# Patient Record
Sex: Female | Born: 1940 | Hispanic: Yes | Marital: Married | Smoking: Never smoker
Health system: Western US, Academic
[De-identification: ages and names within clinical notes are randomized; demographics above are authoritative.]

## PROBLEM LIST (undated history)

## (undated) DIAGNOSIS — N184 Chronic kidney disease, stage 4 (severe): Secondary | ICD-10-CM

## (undated) DIAGNOSIS — E119 Type 2 diabetes mellitus without complications: Secondary | ICD-10-CM

## (undated) DIAGNOSIS — I10 Essential (primary) hypertension: Secondary | ICD-10-CM

---

## 2018-09-30 ENCOUNTER — Other Ambulatory Visit: Payer: Self-pay | Admitting: Gastroenterology

## 2018-09-30 DIAGNOSIS — K8050 Calculus of bile duct without cholangitis or cholecystitis without obstruction: Secondary | ICD-10-CM

## 2018-10-06 ENCOUNTER — Telehealth: Payer: Self-pay | Admitting: Gastroenterology

## 2018-10-06 DIAGNOSIS — K8050 Calculus of bile duct without cholangitis or cholecystitis without obstruction: Secondary | ICD-10-CM

## 2018-10-06 NOTE — Telephone Encounter (Signed)
Called patient with interpreter over phone, patient is feeling better since having ERCP with biliary stent placement on 9/18 ( 7 Fr x 5 cm). Patient was noted to have a large peri-ampullary diverticulum and a large CBD stone (1.3 x 1.7 cm) occupying entire bile duct lumen. Stone was unable to be removed. Patient is agreeable to pursuing ERCP on 10/7 at 12pm. She understands that she will need COVID test prior to procedure as well. Orders placed.    Debe Coder, MD  Advanced Endoscopy Fellow  PGY-7  Pager: [phone]

## 2018-10-11 NOTE — Pre Sedation (Addendum)
Gastroenterology/hepatology Pre-procedure History & Physical   IMMEDIATE PRE-SEDATION ASSESSMENT              Referring provider: No primary care provider on file.    Procedure: ERCP     HPI: Cristina Carter is a 78yr old female with choledocholithiasis with ERCP done on 9/18 at OSH with placement of a 7 Fr x 5 cm plastic biliary stent. 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 Unable to extract stone at that time. Presenting for stent removal and stone extraction today    ROS: A complete ROS was obtained and is negative except as mentioned in the HPI.    All available medical, surgical, personal/social history was reviewed.     Physical exam:   GENERAL: A&Ox3, NAD.  HEENT: Anicteric sclera  CARDIAC: Normal rate, regular rhythm.   LUNGS: CTAB  ABDOMEN: Normoactive bowel sounds.  Soft, nondistended, nontender.  No guarding or rebound.   EXTREMITIES: No LE edema.   SKIN: No jaundice.    Labs/imaging:   Performed at [facility], reviewed: Refer to computer database in the electronic medical record.   Performed outside of [facility] (reviewed if applicable): N/A    Impression/ plan:   ERCP with cholangioscopy/EHL     Patient has given permission to discuss biopsy/procedure results with family and/or leave message on voicemail.    Pre-Sedation/Anesthesia Assessment:    Patient is ASA status: 3 - Moderate to severe systemic disease that limits activity but not incapacitating    Airway Assessment:    Mallampati class 2  ROM normal        Debe Coder, MD  Advanced Endoscopy Fellow  PGY-7  Pager: [phone]    =====================    This patient was seen, evaluated and care plan was developed with the Fellow.  I agree with the assessment and plan as outlined in the [place] note.        Electronically signed by:  Harley Alto, MD, [facility]  Clinical Professor

## 2018-10-12 ENCOUNTER — Ambulatory Visit: Payer: Medicare Other | Attending: Gastroenterology

## 2018-10-12 DIAGNOSIS — K8050 Calculus of bile duct without cholangitis or cholecystitis without obstruction: Secondary | ICD-10-CM | POA: Insufficient documentation

## 2018-10-12 DIAGNOSIS — Z20828 Contact with and (suspected) exposure to other viral communicable diseases: Secondary | ICD-10-CM | POA: Insufficient documentation

## 2018-10-12 LAB — COVID-2019 RNA, QUAL: SARS-CoV-2: NOT DETECTED

## 2018-10-12 NOTE — Nursing Note (Signed)
Patient identified using two identifiers.   The following procedure was completed per physician's order:  COVID 19 Test  Provided patient information regarding procedure  Nasopharyngeal swab collected on both sides of the patient's nose:  Yes   COVID specimen was sent to lab.     Droplet precautions were followed when caring the patient:  Patient was wearing a mask: Yes     During procedure, I was wearing full PPE: gown, face shield/googles, gloves, and N95 mask.  Patient remained in their vehicle during specimen collection and did not enter the clinic.  Patient tolerated procedure well.    Toshiye Kever Jane de Guzman-Pascual, LVN

## 2018-10-14 ENCOUNTER — Encounter: Payer: Self-pay | Admitting: Gastroenterology

## 2018-10-14 ENCOUNTER — Ambulatory Visit (HOSPITAL_BASED_OUTPATIENT_CLINIC_OR_DEPARTMENT_OTHER): Payer: Medicare Other | Admitting: Anesthesiology

## 2018-10-14 ENCOUNTER — Ambulatory Visit
Admission: RE | Admit: 2018-10-14 | Discharge: 2018-10-14 | Disposition: A | Discharge status: Home / Self Care | Payer: Medicare Other | Source: Ambulatory Visit | Attending: Gastroenterology | Admitting: Gastroenterology

## 2018-10-14 ENCOUNTER — Ambulatory Visit: Payer: Medicare Other | Admitting: Anesthesiology

## 2018-10-14 DIAGNOSIS — E781 Pure hyperglyceridemia: Secondary | ICD-10-CM

## 2018-10-14 DIAGNOSIS — K8050 Calculus of bile duct without cholangitis or cholecystitis without obstruction: Secondary | ICD-10-CM | POA: Insufficient documentation

## 2018-10-14 DIAGNOSIS — N189 Chronic kidney disease, unspecified: Secondary | ICD-10-CM

## 2018-10-14 DIAGNOSIS — I447 Left bundle-branch block, unspecified: Secondary | ICD-10-CM | POA: Insufficient documentation

## 2018-10-14 DIAGNOSIS — R9431 Abnormal electrocardiogram [ECG] [EKG]: Secondary | ICD-10-CM

## 2018-10-14 DIAGNOSIS — I10 Essential (primary) hypertension: Secondary | ICD-10-CM

## 2018-10-14 HISTORY — DX: Essential (primary) hypertension: I10

## 2018-10-14 HISTORY — DX: Type 2 diabetes mellitus without complications: E11.9

## 2018-10-14 HISTORY — PX: EGD: GILAB00003

## 2018-10-14 HISTORY — PX: ERCP: GILAB00005

## 2018-10-14 LAB — POC ABL CRITICAL LAB PANEL
Anion Gap: 14.4 mmol/L
Anion GapC: 10.4 mmol/L
CBase: -0.7 mmol/L (ref <=3.0)
CO2: 25.5 mmol/L
Cl-: 108 mmol/L (ref 95–110)
Glucose: 142 mg/dL — ABNORMAL HIGH (ref 70–99)
HCO3: 24.3 mmol/L (ref 20.0–28.0)
K+: 4 mmol/L (ref 3.3–4.8)
Lactate: 1.1 mmol/L (ref 0.4–1.3)
Na+: 142 mmol/L (ref 137–147)
O2 Sat: 82.4 % (ref 70.0–99.3)
POC ABL90 FMetHb: 0.1 % (ref 0.0–1.5)
POC ABL90 Hematocrit: 39.3 % (ref 33.0–53.0)
POC ABL90 Patient Temp: 37 Cel
POC ABL90 Temp corrected POC2: 40.3 mmHg — ABNORMAL LOW (ref 42.0–50.0)
POC ABL90 Temp corrected pH: 7.388 (ref 7.300–7.400)
ctHgb: 12.8 g/dL (ref 12.00–16.00)
fCOHb: 1.3 % (ref 0.5–1.5)
fO2Hb: 81.2 %
ion Ca: 1.22 mmol/L (ref 1.17–1.31)
pCO2: 40.3 mmHg — ABNORMAL LOW (ref 42.0–50.0)
pH: 7.388 (ref 7.300–7.400)
pO2 Temp Corrected: 45.9 mmHg (ref 30.0–55.0)
pO2: 45.9 mmHg (ref 30.0–55.0)

## 2018-10-14 MED ORDER — LEVOFLOXACIN 500 MG/100 ML IN 5 % DEXTROSE INTRAVENOUS PIGGYBACK
500.0000 mg | INJECTION | Freq: Once | INTRAVENOUS | Status: AC
Start: 2018-10-14 — End: 2018-10-14
  Administered 2018-10-14: 14:00:00 500 mg via INTRAVENOUS
  Filled 2018-10-14: qty 100

## 2018-10-14 MED ORDER — INTRAOP ROCURONIUM 10 MG/ML INJ 5 ML VIAL
Status: DC | PRN
Start: 2018-10-14 — End: 2018-10-14
  Administered 2018-10-14: 50 mg via INTRAVENOUS

## 2018-10-14 MED ORDER — PROPOFOL 10 MG/ML INTRAVENOUS EMULSION
INTRAVENOUS | Status: AC
Start: 2018-10-14 — End: 2018-10-14
  Filled 2018-10-14: qty 20

## 2018-10-14 MED ORDER — WATER FOR INJECTION, STERILE INJECTION SOLUTION
1.0000 mL | Freq: Once | INTRAMUSCULAR | Status: AC
Start: 2018-10-14 — End: 2018-10-14
  Administered 2018-10-14: 15:00:00 20 mL via INTRADUCTAL

## 2018-10-14 MED ORDER — INTRAOP FENTANYL 50 MCG/ML INJ 5 ML AMP
Status: DC | PRN
Start: 2018-10-14 — End: 2018-10-14
  Administered 2018-10-14: 15:00:00 50 ug via INTRAVENOUS
  Administered 2018-10-14: 100 ug via INTRAVENOUS

## 2018-10-14 MED ORDER — SIMETHICONE 40 MG/0.6 ML ORAL DROPS,SUSPENSION
40.0000 mg | Freq: Once | ORAL | Status: AC
Start: 2018-10-14 — End: 2018-10-14
  Administered 2018-10-14: 14:00:00 80 mg
  Filled 2018-10-14: qty 1.2

## 2018-10-14 MED ORDER — LIDOCAINE HCL 20 MG/ML (2 %) INJECTION SOLUTION
INTRAMUSCULAR | Status: DC | PRN
Start: 2018-10-14 — End: 2018-10-14
  Administered 2018-10-14: 3 mL via INTRAVENOUS

## 2018-10-14 MED ORDER — INTRAOP DEXMEDETOMIDINE 100 MCG/ML IV BOLUS DOSE
Status: DC | PRN
Start: 2018-10-14 — End: 2018-10-14
  Administered 2018-10-14: 13:00:00 60 ug via INTRAVENOUS

## 2018-10-14 MED ORDER — LACTATED RINGERS IV INFUSION
INTRAVENOUS | Status: DC
Start: 2018-10-14 — End: 2018-10-20
  Administered 2018-10-14: 13:00:00 via INTRAVENOUS

## 2018-10-14 MED ORDER — FLUMAZENIL 0.1 MG/ML INTRAVENOUS SOLUTION
0.2000 mg | INTRAVENOUS | Status: DC | PRN
Start: 2018-10-14 — End: 2018-10-20

## 2018-10-14 MED ORDER — FENTANYL (PF) 50 MCG/ML INJECTION SOLUTION
INTRAMUSCULAR | Status: AC
Start: 2018-10-14 — End: 2018-10-14
  Filled 2018-10-14: qty 4

## 2018-10-14 MED ORDER — CIPROFLOXACIN 500 MG TABLET
500.0000 mg | ORAL_TABLET | Freq: Two times a day (BID) | ORAL | 0 refills | Status: AC
Start: 2018-10-14 — End: 2018-10-17

## 2018-10-14 MED ORDER — ONDANSETRON HCL (PF) 4 MG/2 ML INJECTION SOLUTION
INTRAMUSCULAR | Status: DC | PRN
Start: 2018-10-14 — End: 2018-10-14
  Administered 2018-10-14: 14:00:00 4 mg via INTRAVENOUS

## 2018-10-14 MED ORDER — NALOXONE 0.4 MG/ML INJECTION SOLUTION
0.4000 mg | INTRAMUSCULAR | Status: DC | PRN
Start: 2018-10-14 — End: 2018-10-20

## 2018-10-14 MED ORDER — SUGAMMADEX 100 MG/ML INTRAVENOUS SOLUTION
INTRAVENOUS | Status: DC | PRN
Start: 2018-10-14 — End: 2018-10-14
  Administered 2018-10-14: 200 mg via INTRAVENOUS

## 2018-10-14 MED ORDER — INTRAOP PROPOFOL INJ 20 ML VIAL (BOLUS+INFUSION)
Status: DC | PRN
Start: 2018-10-14 — End: 2018-10-14
  Administered 2018-10-14: 13:00:00 50 mg via INTRAVENOUS

## 2018-10-14 MED ORDER — EPINEPHRINE 0.1 MG/ML INJECTION SYRINGE
0.3000 mg | INJECTION | INTRAMUSCULAR | Status: DC | PRN
Start: 2018-10-14 — End: 2018-10-20
  Filled 2018-10-14: qty 20

## 2018-10-14 MED ORDER — INTRAOP DEXAMETHASONE 4 MG/ML INJ 1 ML VIAL
Status: DC | PRN
Start: 2018-10-14 — End: 2018-10-14
  Administered 2018-10-14: 14:00:00 10 mg via INTRAVENOUS

## 2018-10-14 MED ORDER — GLUCAGON HCL 1 MG/ML SOLUTION FOR INJECTION
0.2500 mg | INTRAMUSCULAR | Status: DC
Start: 2018-10-14 — End: 2018-10-20
  Filled 2018-10-14: qty 2

## 2018-10-14 MED ORDER — LIDOCAINE HCL 2 % MUCOSAL SOLUTION
5.0000 mL | Freq: Once | Status: DC
Start: 2018-10-14 — End: 2018-10-20
  Filled 2018-10-14: qty 15

## 2018-10-14 MED ORDER — INDOMETHACIN 50 MG RECTAL SUPPOSITORY
100.0000 mg | Freq: Once | RECTAL | Status: AC
Start: 2018-10-14 — End: 2018-10-14
  Administered 2018-10-14: 100 mg via RECTAL
  Filled 2018-10-14: qty 2

## 2018-10-14 MED ORDER — EPINEPHRINE 0.1 MG/ML INJECTION SYRINGE
1.0000 mg | INJECTION | INTRAMUSCULAR | Status: DC | PRN
Start: 2018-10-14 — End: 2018-10-20

## 2018-10-14 MED ORDER — ATROPINE 0.1 MG/ML INJECTION SYRINGE
0.5000 mg | INJECTION | INTRAMUSCULAR | Status: DC | PRN
Start: 2018-10-14 — End: 2018-10-20

## 2018-10-14 MED ORDER — IOHEXOL 300 MG IODINE/ML INTRAVENOUS SOLUTION
1.0000 mL | Freq: Once | INTRAVENOUS | Status: DC
Start: 2018-10-14 — End: 2018-10-20

## 2018-10-14 NOTE — Nurse Discharge Note (Signed)
Received report form Loa Socks, RN post ERCP procedure that patient's EKG showed LBBB, needs to establish care with Cardiologist. Written discharged instructions reviewed with member via [employer]	[place], via [employer]. Member states she will let her PCP know about her abnormal EKG. Ambulated to bathroom in steady gait with daughter's assist. NAD noted. Discharged via wheelchair escorted by transport accompanied by daughter.

## 2018-10-14 NOTE — Anesthesia Postprocedure Evaluation (Signed)
Patient: Cristina Carter    ERCP W/ANES    Anesthesia Type: general    Stop Bang:      Vital Signs (Last Recorded):  BP: 118/75 (10/14/18 1545)  Pulse: 89 (10/14/18 1545)  Resp: 13 (10/14/18 1545)  Temp Max: 36.5 C (97.7 F)  (Last 24 hours)  Temp: 36.5 C (97.7 F) (10/14/18 1457)  SpO2: 94 % (10/14/18 1545) on    Device (Oxygen Therapy): room air (10/14/18 1545)      Anesthesia Post Evaluation    Procedure: * No procedures listed *  Location: [facility] ANEST[facility]IA OR (DON'T SCHEDULE)  Anesthesia: General    Patient location during evaluation: PACU  Patient participation: complete - patient participated  Level of consciousness: awake and alert  Pain score: 0  Pain management: adequate  Airway patency: patent  Anesthetic complications: no  Cardiovascular status: blood pressure returned to baseline  Respiratory status: unassisted  Hydration status: euvolemic  Nausea and Vomiting: absent     Rosalina Dingwall Fredricka Bonine, MD          Darrick Meigs Fredricka Bonine, MD

## 2018-10-14 NOTE — Anesthesia Procedure Notes (Signed)
Airway  Date/Time: 10/14/2018 1:22 PM    Staff    Patient location:  GI LAB          Indications and Patient Condition    Spontaneous Ventilation: absent  Sedation level: level 0: deep/analgesia  Preoxygenated: yes  Patient position: sniffing  MILS not maintained throughout  Mask difficulty assessment: 1 - vent by mask    Final Airway Details    Final airway type: endotracheal airway      Successful airway: cuffed ETT    Successful intubation technique: direct laryngoscopy  Facilitating devices/methods: intubating stylet  Endotracheal tube insertion site: oral  Blade: Miller  Blade size: #2  ETT size: 7.0 mm  Cormack-Lehane Classification: grade I - full view of glottis  Placement verified by: chest auscultation and capnometry   Measured from: teeth  Secured at 22 cm.  Number of attempts at approach: 1  Ventilation between attempts: none  Number of other approaches attempted: 0

## 2018-10-14 NOTE — Anesthesia Preprocedure Evaluation (Addendum)
Anesthesia Evaluation    History of Present Illness  78 YO F presents to GI Lab for ERCP  Fasted  No heart problems or stroke   denies diabetes  Some pain in epigastrium probably from gall stones  ECG done in preop  Negative anesthesia history    Airway   Mallampati: II  TM distance: >3 FB     Neck ROM is limited.     Dental    (+) missing       Pulmonary - negative for pulmonary conditions with ROS and normal exam    Patient's breath sounds clear to auscultation. Cardiovascular - cardiovascular exam normal  (+) hypertension (well controlled),  hypertriglyceridemia,     ECG reviewed  Patient has poor exercise tolerance. (Pt ambulates with cane. Activity limited by pain in knees and hips.)   Neuro/Psych - negative neuro/psych ROS    GI/Hepatic/Renal   (+) chronic renal disease (CKD),         Abdominal    Endo/Other - negative for endocrine conditions with ROS    (-) diabetes mellitus  Smoking History                        Anesthesia Plan    ASA 2     general     Intravenous induction    Postoperative administration of opioids is intended.  Trial extubation is planned.    Anesthetic plan and risks discussed with patient.  Resident/Fellow/CRNA discussed the plan with the attending.  I personally performed a physical assessment on this patient.

## 2018-10-14 NOTE — Procedures (Addendum)
Pre-Procedure Time Out Checklist  (do not remove)     Attestation:  ID verified by two sources (select any two from list): MRN, DOB and Name  Was this an emergency procedure?  no     I attest that I verified the following information prior to performing the procedure: Patient ID and Procedure     Procedure: Endoscopic Retrograde Cholangiopancreatography    Patient Name:  Cristina Carter  MRN: [id number]    DOB: 03-Oct-1940  DOS: 10/14/2018    GASTROENTEROLOGY OPERATION RECORD    PROCEDURE: Endoscopic Retrograde Cholangiopancreatography    SUB-PROCEDURE:  1. Cannulation of bile duct with BSC hydrotome and "0.035 guidewire alongside previously placed plastic biliary stent  2. Removal of previously placed biliary stent with micro mini snare  3. Cholangiogram  4. Cholangioscopy with EHL  5. Sphincteroplasty using 10 mm x 4 cm biliary dilation balloon  6. Basket sweeps with Olympus VorticCatch wire-guided basket  7. Balloon sweeps with BSC extraction balloon  8. Occlusion cholangiogram      ENDO ATTENDING: Travell Desaulniers was present during the entire viewing portion of the procedure    PRIMARY ASSISTANT: Debe Coder, MD      REFERRING PHYSICIAN:  Johnna Acosta, MD      INDICATION:   78yr old female with choledocholithiasis with ERCP done on 9/18 at OSH with placement of a 7 Fr x 5 cm plastic biliary stent. 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 Unable to extract stone at that time. Presenting for stent removal and stone extraction today       HEALTH STATUS (ASA GRADE): moderate stable      CONSENT:  Informed consent was obtained with explanation of risks, benefits, and alternative options. We explained that risks included, but were not restricted to, infection, bleeding, damage to organs including perforation, reaction to medicines, missed lesion, incomplete exam, and pancreatitis.    Time out was performed to verify patient's name, medical record number, and procedure to be  performed.      START TIME: 1336  END TIME: 1444      MEDICATION: General anesthesia   100 mg rectal Indomethacin was given  500 mg Levofloxacin was given   1L of LR was given    FLUOROSCOPY: 244.2 sec, 115 mGy         PROCEDURE DETAILS:    The procedure was performed at [facility]. Written informed consent was obtained and patient placed in the semiprone swimmer's position. Vital signs were monitored throughout the procedure and noted to be stable. Surgical pause was performed. A side viewing endoscope of TJF180 series was used for the procedure. The scope was advanced through the bite block into the eosphagus, through the GE junction, into the stomach, through the pylorus into the duodenal bulb and eventually the second portion of the duodenum. The scope was then pulled into the short scope position.    Patient with tortuous duodenum.  Previously seen plastic biliary stent seen protruding from biliary orifice.  A large periampullary diverticulum was noted.  Ampulla was located along the proximal and inferior margin of the diverticulum.  Using the [facility] hydro-tome and a "0.035 guidewire, the bile duct was electively cannulated alongside the previously placed plastic biliary stent.  Using a micro mini snare, the biliary stent was then removed.  A cholangiogram was then performed which revealed a dilated bile duct to 12 mm with a shelf-like deformity noted in the distal CBD indicative of an impacted stone in  the distal CBD.  The cholangioscope was then inserted over guidewire into the bile duct.  A large yellow brown pigment stone was seen occupying the entire lumen of the bile duct.  Using an EHL probe, EHL was performed with the stone fragmented into multiple smaller fragments.  The cholangioscope was then removed and a sphincteroplasty was then performed with a 10 mm x 4 cm biliary dilation balloon.  A small amount of self-limited bleeding noted after sphincteroplasty.  An Olympus VorticCatch 20 mm wire  guided basket was then inserted into the bile duct.  Multiple basket sweeps were performed with removal of large amount of stone fragments and biliary sludge.  A cholangiogram was then performed through the basket which revealed additional filling defects in the more proximal CBD.  Additional basket sweeps were performed with removal of additional stone fragments.  A BSC extraction balloon was then inserted over guidewire to the bile duct.  Multiple balloon sweeps were performed with mostly clear bile extracted.  An occlusion cholangiogram was then performed which revealed no additional filling defects.  Given the bile duct was clear and there was good bile flow coming from biliary orifice, decision made not to place a biliary stent.  IV Levaquin and rectal indomethacin given at the end of the procedure.      At the conclusion of the procedure, the accessories were withdrawn, wheels were unlocked, air was withdrawn from the small bowel, and scope was withdrawn back into the stomach. Then, the stomach was decompressed and the scope was completely withdrawn out of the patient. The patient tolerated the procedure well. Post procedure complications: none. Attending physician was present throughout the entire procedure.        IMPRESSION:  1. Choledocholithiasis with large brown pigment stone impacted in distal CBD s/p treatment with EHL and removal of stone fragments as described above. Previously placed biliary stent was removed. Sphincteroplasty was performed to 10 mm  2. Large peri-ampullary diverticulum    RECOMMENDATIONS:   Monitor for immediate complications such as post-ERCP pancreatitis (epigastric pain/nausea/vomiting), post-sphincterotomy bleeding, perforation, etc.    PO Ciprofloxacin 500mg  BID for 3 days   Patient can be referred to surgery for evaluation for cholecystectomy      Debe Coder, MD  Advanced Endoscopy Fellow  PGY-7  Pager: [phone]  ====================    I supervised the entire procedure.    This patient was seen, evaluated and care plan was developed with the Fellow.  I agree with the assessment and plan as outlined in the [place] note.      Sincerely,     Electronically signed by:  Harley Alto, MD, [facility]  Clinical Professor

## 2018-10-15 NOTE — Discharge Instructions (Signed)
Discharge Instructions for ERCP    Findings:  ERCP bile duct stone(s)    Activity:    Rest today, resume usual activitiy tomorrow     Diet:    Resume usual diet:     Medication:    Resume all usual medications    Follow up:   Call primary care physician for follow-up appointment      During your procedure, air was pumped into your GI tract so your doctor could see clearly to make a diagnosis and/or treat your problem.     Some possible side effects you may experience are:   - Discomfort due to a distended (bloated) abdomen which will subside after a few hours to two days.   - Nausea may be a side effect of the medication and will subside.   - The medications you received may make you dizzy and sleepy, it is important that you do not drive, operate machinery or drink alcohol for at least one day.   - Severe pain is not expected and should be reported    Other side effects may include:  ERCP: if fever, vomiting blood, black stools, or persistent abdominal pain call the phone number listed below.    If problems, call [facility] lab at [phone] during business hours Monday through Friday 7:30am to 4:30 pm.  After hours call [phone] and ask to speak to the GI Fellow on call.

## 2018-10-16 LAB — ELECTROCARDIOGRAM WITH RHYTHM STRIP: QTC: 500

## 2019-01-23 ENCOUNTER — Other Ambulatory Visit: Payer: Self-pay | Admitting: Family Medicine

## 2019-06-08 DEATH — deceased

## 2020-07-06 ENCOUNTER — Emergency Department (EMERGENCY_DEPARTMENT_HOSPITAL): Payer: Medicare Other

## 2020-07-06 ENCOUNTER — Inpatient Hospital Stay
Admission: AC | Admit: 2020-07-06 | Discharge: 2020-07-13 | DRG: 480 | Disposition: A | Discharge status: Home Health | Payer: Medicare Other | Source: Other Acute Inpatient Hospital | Attending: ORTHOPAEDIC SURGERY | Admitting: ORTHOPAEDIC SURGERY

## 2020-07-06 DIAGNOSIS — T1490XA Injury, unspecified, initial encounter: Secondary | ICD-10-CM

## 2020-07-06 DIAGNOSIS — W010XXA Fall on same level from slipping, tripping and stumbling without subsequent striking against object, initial encounter: Secondary | ICD-10-CM | POA: Diagnosis present

## 2020-07-06 DIAGNOSIS — M9701XA Periprosthetic fracture around internal prosthetic right hip joint, initial encounter: Secondary | ICD-10-CM | POA: Diagnosis present

## 2020-07-06 DIAGNOSIS — K5730 Diverticulosis of large intestine without perforation or abscess without bleeding: Secondary | ICD-10-CM

## 2020-07-06 DIAGNOSIS — Z28311 Partially vaccinated for covid-19: Secondary | ICD-10-CM

## 2020-07-06 DIAGNOSIS — S72301A Unspecified fracture of shaft of right femur, initial encounter for closed fracture: Secondary | ICD-10-CM

## 2020-07-06 DIAGNOSIS — M546 Pain in thoracic spine: Secondary | ICD-10-CM

## 2020-07-06 DIAGNOSIS — E669 Obesity, unspecified: Secondary | ICD-10-CM | POA: Diagnosis present

## 2020-07-06 DIAGNOSIS — M545 Low back pain, unspecified: Secondary | ICD-10-CM

## 2020-07-06 DIAGNOSIS — K829 Disease of gallbladder, unspecified: Secondary | ICD-10-CM

## 2020-07-06 DIAGNOSIS — Y9301 Activity, walking, marching and hiking: Secondary | ICD-10-CM | POA: Diagnosis present

## 2020-07-06 DIAGNOSIS — I120 Hypertensive chronic kidney disease with stage 5 chronic kidney disease or end stage renal disease: Secondary | ICD-10-CM

## 2020-07-06 DIAGNOSIS — N184 Chronic kidney disease, stage 4 (severe): Secondary | ICD-10-CM | POA: Diagnosis present

## 2020-07-06 DIAGNOSIS — Z6831 Body mass index (BMI) 31.0-31.9, adult: Secondary | ICD-10-CM

## 2020-07-06 DIAGNOSIS — J9601 Acute respiratory failure with hypoxia: Secondary | ICD-10-CM | POA: Diagnosis not present

## 2020-07-06 DIAGNOSIS — Y998 Other external cause status: Secondary | ICD-10-CM

## 2020-07-06 DIAGNOSIS — M25511 Pain in right shoulder: Secondary | ICD-10-CM

## 2020-07-06 DIAGNOSIS — M79621 Pain in right upper arm: Secondary | ICD-10-CM

## 2020-07-06 DIAGNOSIS — N186 End stage renal disease: Secondary | ICD-10-CM

## 2020-07-06 DIAGNOSIS — M810 Age-related osteoporosis without current pathological fracture: Secondary | ICD-10-CM | POA: Diagnosis present

## 2020-07-06 DIAGNOSIS — M25551 Pain in right hip: Secondary | ICD-10-CM

## 2020-07-06 DIAGNOSIS — R9431 Abnormal electrocardiogram [ECG] [EKG]: Secondary | ICD-10-CM

## 2020-07-06 DIAGNOSIS — Z79899 Other long term (current) drug therapy: Secondary | ICD-10-CM

## 2020-07-06 DIAGNOSIS — R918 Other nonspecific abnormal finding of lung field: Secondary | ICD-10-CM

## 2020-07-06 DIAGNOSIS — M542 Cervicalgia: Secondary | ICD-10-CM

## 2020-07-06 DIAGNOSIS — K8020 Calculus of gallbladder without cholecystitis without obstruction: Secondary | ICD-10-CM

## 2020-07-06 DIAGNOSIS — E1122 Type 2 diabetes mellitus with diabetic chronic kidney disease: Secondary | ICD-10-CM | POA: Diagnosis present

## 2020-07-06 DIAGNOSIS — I4510 Unspecified right bundle-branch block: Secondary | ICD-10-CM

## 2020-07-06 DIAGNOSIS — Z96651 Presence of right artificial knee joint: Secondary | ICD-10-CM | POA: Diagnosis present

## 2020-07-06 DIAGNOSIS — Z20822 Contact with and (suspected) exposure to covid-19: Secondary | ICD-10-CM

## 2020-07-06 DIAGNOSIS — R079 Chest pain, unspecified: Secondary | ICD-10-CM

## 2020-07-06 DIAGNOSIS — M978XXA Periprosthetic fracture around other internal prosthetic joint, initial encounter: Secondary | ICD-10-CM

## 2020-07-06 DIAGNOSIS — I129 Hypertensive chronic kidney disease with stage 1 through stage 4 chronic kidney disease, or unspecified chronic kidney disease: Secondary | ICD-10-CM | POA: Diagnosis present

## 2020-07-06 DIAGNOSIS — D62 Acute posthemorrhagic anemia: Secondary | ICD-10-CM | POA: Diagnosis not present

## 2020-07-06 DIAGNOSIS — K219 Gastro-esophageal reflux disease without esophagitis: Secondary | ICD-10-CM | POA: Diagnosis present

## 2020-07-06 DIAGNOSIS — J9811 Atelectasis: Secondary | ICD-10-CM | POA: Diagnosis not present

## 2020-07-06 DIAGNOSIS — M19021 Primary osteoarthritis, right elbow: Secondary | ICD-10-CM

## 2020-07-06 HISTORY — DX: Chronic kidney disease, stage 4 (severe): N18.4

## 2020-07-06 LAB — CBC WITH DIFFERENTIAL
Basophils % Auto: 0.9 %
Basophils Abs Auto: 0.1 10*3/uL (ref 0.0–0.2)
Eosinophils % Auto: 1.2 %
Eosinophils Abs Auto: 0.1 10*3/uL (ref 0.0–0.5)
Hematocrit: 33.3 % — ABNORMAL LOW (ref 36.0–46.0)
Hemoglobin: 11.5 g/dL — ABNORMAL LOW (ref 12.0–16.0)
Lymphocytes % Auto: 17.1 %
Lymphocytes Abs Auto: 1.1 10*3/uL (ref 1.0–4.8)
MCH: 31.9 pg (ref 27.0–33.0)
MCHC: 34.4 % (ref 32.0–36.0)
MCV: 92.7 fL (ref 80.0–100.0)
MPV: 8.9 fL (ref 6.8–10.0)
Monocytes % Auto: 11.6 %
Monocytes Abs Auto: 0.8 10*3/uL (ref 0.1–0.8)
Neutrophils % Auto: 69.2 %
Neutrophils Abs Auto: 4.6 10*3/uL (ref 1.8–7.7)
Platelet Count: 183 10*3/uL (ref 130–400)
RDW: 13.6 % (ref 0.0–14.7)
Red Blood Cell Count: 3.59 10*6/uL — ABNORMAL LOW (ref 4.00–5.20)
White Blood Cell Count: 6.7 10*3/uL (ref 4.5–11.0)

## 2020-07-06 LAB — CBC NO DIFFERENTIAL
Hematocrit: 34.9 % — ABNORMAL LOW (ref 36.0–46.0)
Hematocrit: 34 % — ABNORMAL LOW (ref 36.0–46.0)
Hematocrit: 33.3 % — ABNORMAL LOW (ref 36.0–46.0)
Hemoglobin: 12.2 g/dL (ref 12.0–16.0)
Hemoglobin: 11.7 g/dL — ABNORMAL LOW (ref 12.0–16.0)
Hemoglobin: 11.5 g/dL — ABNORMAL LOW (ref 12.0–16.0)
MCH: 32.5 pg (ref 27.0–33.0)
MCH: 31.9 pg (ref 27.0–33.0)
MCH: 31.9 pg (ref 27.0–33.0)
MCHC: 35.1 % (ref 32.0–36.0)
MCHC: 34.4 % (ref 32.0–36.0)
MCHC: 34.4 % (ref 32.0–36.0)
MCV: 92.7 fL (ref 80.0–100.0)
MCV: 92.8 fL (ref 80.0–100.0)
MCV: 92.7 fL (ref 80.0–100.0)
MPV: 9 fL (ref 6.8–10.0)
MPV: 8.7 fL (ref 6.8–10.0)
MPV: 8.9 fL (ref 6.8–10.0)
Platelet Count: 192 10*3/uL (ref 130–400)
Platelet Count: 189 10*3/uL (ref 130–400)
Platelet Count: 183 10*3/uL (ref 130–400)
RDW: 13.2 % (ref 0.0–14.7)
RDW: 13.4 % (ref 0.0–14.7)
RDW: 13.6 % (ref 0.0–14.7)
Red Blood Cell Count: 3.76 10*6/uL — ABNORMAL LOW (ref 4.00–5.20)
Red Blood Cell Count: 3.67 10*6/uL — ABNORMAL LOW (ref 4.00–5.20)
Red Blood Cell Count: 3.59 10*6/uL — ABNORMAL LOW (ref 4.00–5.20)
White Blood Cell Count: 7.7 10*3/uL (ref 4.5–11.0)
White Blood Cell Count: 7 10*3/uL (ref 4.5–11.0)
White Blood Cell Count: 6.7 10*3/uL (ref 4.5–11.0)

## 2020-07-06 LAB — BLD GAS VENOUS
Base Excess, Ven: 0 mEq/L (ref <=2)
HCO3, Ven: 26 mEq/L (ref 20–28)
O2 Sat, Ven: 60 % — ABNORMAL LOW (ref 70–100)
PCO2, Ven: 48 mm Hg (ref 35–50)
PO2, Ven: 32 mm Hg (ref 30–55)
pH, VEN: 7.34 (ref 7.30–7.40)

## 2020-07-06 LAB — PHOSPHORUS (PO4): Phosphorus (PO4): 4 mg/dL (ref 2.5–4.5)

## 2020-07-06 LAB — INR
INR: 0.96 (ref 0.87–1.18)
Prothrombin Time: 8.9 secs (ref 8.4–10.7)

## 2020-07-06 LAB — BASIC METABOLIC PANEL
Calcium: 9.3 mg/dL (ref 8.6–10.0)
Calcium: 9.1 mg/dL (ref 8.6–10.0)
Carbon Dioxide Total: 21 mmol/L — ABNORMAL LOW (ref 22–29)
Carbon Dioxide Total: 21 mmol/L — ABNORMAL LOW (ref 22–29)
Chloride: 105 mmol/L (ref 98–107)
Chloride: 104 mmol/L (ref 98–107)
Creatinine Serum: 0.94 mg/dL (ref 0.51–1.17)
Creatinine Serum: 0.97 mg/dL (ref 0.51–1.17)
E-GFR Creatinine (Female): 57 mL/min/{1.73_m2}
E-GFR Creatinine (Female): 55 mL/min/{1.73_m2}
Glucose: 130 mg/dL — ABNORMAL HIGH (ref 74–109)
Glucose: 151 mg/dL — ABNORMAL HIGH (ref 74–109)
Potassium: 4.3 mmol/L (ref 3.4–5.1)
Potassium: 4.1 mmol/L (ref 3.4–5.1)
Sodium: 139 mmol/L (ref 136–145)
Sodium: 139 mmol/L (ref 136–145)
Urea Nitrogen, Blood (BUN): 15 mg/dL (ref 6–20)
Urea Nitrogen, Blood (BUN): 15 mg/dL (ref 6–20)

## 2020-07-06 LAB — HEPATIC FUNCTION PANEL
Alanine Transferase (ALT): 19 U/L (ref <=33)
Albumin: 4.5 g/dL (ref 4.0–4.9)
Alkaline Phosphatase (ALP): 85 U/L (ref 35–129)
Aspartate Transaminase (AST): 21 U/L (ref <=41)
Bilirubin Direct: <0.2 mg/dL (ref 0.0–0.3)
Bilirubin Total: 0.5 mg/dL (ref <=1.2)
Protein: 7.1 g/dL (ref 6.6–8.7)

## 2020-07-06 LAB — APTT STUDIES: aPTT: 28.4 secs — ABNORMAL LOW (ref 28.8–39.9)

## 2020-07-06 LAB — ALBUMIN: Albumin: 4.2 g/dL (ref 4.0–4.9)

## 2020-07-06 LAB — TYPE AND SCREEN
Ab Scrn-Gel: NEGATIVE
Patient Blood Type: B POS

## 2020-07-06 LAB — COVID-19 (OUTSIDE ORGANIZATION): COVID-19 Results (Outside Organization): NOT DETECTED

## 2020-07-06 LAB — CALCIUM ION WHOLE BLOOD: Calcium Ion Whole Blood: 1.18 mmol/L (ref 1.17–1.31)

## 2020-07-06 LAB — ETHANOL, PLASMA: Ethanol, Plasma: <10 mg/dL (ref <10)

## 2020-07-06 LAB — LIPASE: Lipase: 458 U/L — ABNORMAL HIGH (ref 13–60)

## 2020-07-06 MED ORDER — ACETAMINOPHEN 650 MG/20.3 ML ORAL SOLUTION
1000.0000 mg | Freq: Four times a day (QID) | ORAL | Status: DC
Start: 2020-07-06 — End: 2020-07-06

## 2020-07-06 MED ORDER — ONDANSETRON HCL (PF) 4 MG/2 ML INJECTION SOLUTION
4.0000 mg | Freq: Two times a day (BID) | INTRAMUSCULAR | Status: DC | PRN
Start: 2020-07-06 — End: 2020-07-06

## 2020-07-06 MED ORDER — AMLODIPINE 5 MG TABLET
5.0000 mg | ORAL_TABLET | Freq: Every day | ORAL | Status: DC
Start: 2020-07-07 — End: 2020-07-13
  Administered 2020-07-07 – 2020-07-13 (×7): 5 mg via ORAL
  Filled 2020-07-06 (×7): qty 1

## 2020-07-06 MED ORDER — SODIUM CHLORIDE 0.9 % INTRAVENOUS SOLUTION
1000.0000 mg | INTRAVENOUS | Status: AC
Start: 2020-07-06 — End: 2020-07-08
  Administered 2020-07-08: 1000 mg via INTRAVENOUS
  Filled 2020-07-06: qty 10

## 2020-07-06 MED ORDER — ACETAMINOPHEN 1,000 MG/100 ML (10 MG/ML) INTRAVENOUS SOLUTION
1000.0000 mg | Freq: Four times a day (QID) | INTRAVENOUS | Status: AC
Start: 2020-07-07 — End: 2020-07-08

## 2020-07-06 MED ORDER — ACETAMINOPHEN 1,000 MG/100 ML (10 MG/ML) INTRAVENOUS SOLUTION
1000.0000 mg | Freq: Four times a day (QID) | INTRAVENOUS | Status: DC
Start: 2020-07-06 — End: 2020-07-06
  Administered 2020-07-06: 1000 mg via INTRAVENOUS
  Filled 2020-07-06: qty 100

## 2020-07-06 MED ORDER — ATORVASTATIN 10 MG TABLET
10.0000 mg | ORAL_TABLET | Freq: Every day | ORAL | Status: DC
Start: 2020-07-06 — End: 2020-07-13
  Administered 2020-07-07 – 2020-07-12 (×7): 10 mg via ORAL
  Filled 2020-07-06 (×7): qty 1

## 2020-07-06 MED ORDER — ENOXAPARIN 30 MG/0.3 ML SUBCUTANEOUS SYRINGE
30.0000 mg | INJECTION | Freq: Two times a day (BID) | SUBCUTANEOUS | Status: DC
Start: 2020-07-06 — End: 2020-07-13
  Administered 2020-07-07 – 2020-07-13 (×14): 30 mg via SUBCUTANEOUS
  Filled 2020-07-06 (×14): qty 0.3

## 2020-07-06 MED ORDER — OXYCODONE 5 MG TABLET
5.0000 mg | ORAL_TABLET | ORAL | Status: DC | PRN
Start: 2020-07-06 — End: 2020-07-09
  Administered 2020-07-07 – 2020-07-09 (×11): 5 mg via ORAL
  Filled 2020-07-06 (×11): qty 1

## 2020-07-06 MED ORDER — LIDOCAINE HCL 10 MG/ML (1 %) INJECTION SOLUTION
0.5000 mL | INTRAMUSCULAR | Status: AC
Start: 2020-07-06 — End: 2020-07-06
  Administered 2020-07-06: 20 mL
  Filled 2020-07-06: qty 20

## 2020-07-06 MED ORDER — ACETAMINOPHEN 500 MG TABLET
1000.0000 mg | ORAL_TABLET | Freq: Four times a day (QID) | ORAL | Status: DC
Start: 2020-07-06 — End: 2020-07-06

## 2020-07-06 MED ORDER — ROPIVACAINE (PF) 2 MG/ML (0.2 %) INJECTION SOLUTION
60.0000 mL | INTRAMUSCULAR | Status: AC
Start: 2020-07-06 — End: 2020-07-06
  Administered 2020-07-06: 60 mL via PERINEURAL
  Filled 2020-07-06: qty 60

## 2020-07-06 MED ORDER — ENOXAPARIN 30 MG/0.3 ML SUBCUTANEOUS SYRINGE
30.0000 mg | INJECTION | Freq: Two times a day (BID) | SUBCUTANEOUS | Status: DC
Start: 2020-07-06 — End: 2020-07-06

## 2020-07-06 MED ORDER — LOSARTAN 50 MG TABLET
100.0000 mg | ORAL_TABLET | Freq: Every day | ORAL | Status: DC
Start: 2020-07-07 — End: 2020-07-13
  Administered 2020-07-09 – 2020-07-13 (×5): 100 mg via ORAL
  Filled 2020-07-06 (×5): qty 2

## 2020-07-06 MED ORDER — CEFAZOLIN 2 GRAM/100 ML IN DEXTROSE(ISO-OSMOTIC) INTRAVENOUS PIGGYBACK
2.0000 g | INJECTION | INTRAVENOUS | Status: AC
Start: 2020-07-06 — End: 2020-07-08
  Administered 2020-07-08: 2 g via INTRAVENOUS

## 2020-07-06 MED ORDER — ACETAMINOPHEN 500 MG TABLET
1000.0000 mg | ORAL_TABLET | Freq: Four times a day (QID) | ORAL | Status: AC
Start: 2020-07-07 — End: 2020-07-08
  Administered 2020-07-07 (×3): 1000 mg via ORAL
  Filled 2020-07-06 (×3): qty 2

## 2020-07-06 NOTE — Consults (Signed)
ORTHOPAEDIC SURGERY CONSULT/H&P NOTE:    PATIENT: Cristina Carter  MR#: [id number]  SEX: female, AGE: 69yr DATE OF BIRTH: [birth year] SERVICE DATE: 07/06/2020    PRIMARY ATTENDING:  BPrincess Bruins    REASON FOR CONSULT:  R periprosthetic hip fracture    Time received 20:20   Time seen 2030  Consult placed with  complete imaging studies    HPI:  89[facility] Spanish speaking [facility]. MJaid Quirionwas moving some bottles when she tripped over one of them and fell to the ground. She typically ambulates with a walker. ~15 years [place] [facility] of [facility]     Mechanism: MGLF    Past Medical History:   Diagnosis Date    Chronic kidney disease (CKD), stage IV (severe) (HCC)     Diabetes mellitus (HOasis     Hypertension      Past Surgical History:   Procedure Laterality Date    EGD  10/14/2018    ERCP  10/14/2018     No family history on file.  Social History     Occupational History    Not on file   Tobacco Use    Smoking status: Never Smoker    Smokeless tobacco: Never Used   Substance and Sexual Activity    Alcohol use: Never    Drug use: Never    Sexual activity: Not on file     Immunization History   Administered Date(s) Administered    COVID-19 mRNA PF (Moderna) 02/12/2019, 12/29/2019     No Known Allergies    Medications:   No current facility-administered medications on file prior to encounter.     Current Outpatient Medications on File Prior to Encounter   Medication Sig Dispense Refill    Amlodipine (NORVASC) 5 mg Tablet Take 5 mg by mouth. Indications: high blood pressure      atorvastatin calcium (ATORVASTATIN PO) Take 10 mg by mouth every day at bedtime.      FUROsemide (LASIX) 20 mg Tablet Take 20 mg by mouth 2 times daily.      (Not in a hospital admission)    Current Medications:   Current Facility-Administered Medications:     Acetaminophen (OFIRMEV)  IV 1,000 mg, 1,000 mg, IV, Q6H Now, Last Rate: 400 mL/hr at 07/06/20 2014, 1,000 mg at 07/06/20 2014 **OR** Acetaminophen (TYLENOL) Tablet 1,000 mg, 1,000 mg, ORAL, Q6H Now **OR** Acetaminophen (TYLENOL) 650 mg/20.3 mL Solution 1,000 mg, 1,000 mg, NG, Q6H Now, GGerilyn Pilgrim NP    CeFAZolin (KEFZOL, ANCEF) 2 g in Dextrose (iso-osm) 100 mL IVPB, 2 g, IV, ON-CALL OR, WMamie Nick MD    Lidocaine Injection (XYLOCAINE) 1% 0.5-20 mL, 0.5-20 mL, INFILTRATION, AT BJena Gauss MD    Ropivacaine (PF) (NAROPIN) 2 mg/mL (0.2 %) Injection 60 mL, 60 mL, NERVE BLOCK, AT BJena Gauss MD    Vancomycin (VANCOCIN) 1,000 mg in NaCl 0.9% 250 mL Vial Adaptor IVPB, 1,000 mg, IV, ON-CALL OR, WMamie Nick MD    Current Outpatient Medications:     Amlodipine (NORVASC) 5 mg Tablet, Take 5 mg by mouth. Indications: high blood pressure, Disp: , Rfl:     atorvastatin calcium (ATORVASTATIN PO), Take 10 mg by mouth every day at bedtime., Disp: , Rfl:     FUROsemide (LASIX) 20 mg Tablet, Take 20 mg by mouth 2 times  daily., Disp: , Rfl:    The patient's past medical, family, and social history was reviewed and confirmed.    REVIEW OF SYMPTOMS:  ROS: A complete review of systems reveals no headache, no fever/ chills, no CP, no SOB, no abdominal pain, no bladder or bowel dysfunction, please see HPI, all other systems are negative.    VITALS:  Vitals (Current):  Temp: 37 C (98.6 F)  Pulse: 81  BP: 156/84  Resp: 16  SpO2: 96 %  Vitals (24 Hours Min/Max):   Temp Max: 37 C (98.6 F)   Pulse Min: 81 Max: 89   BP: (130-156)/(78-105)     Resp Min: 16 Max: 19   SpO2 Min: 94 % Max: 96 %      EXAM:  General: NAD  Chest: Non-labored breathing  CV: RRR    PE:  Gen: NAD, comfortable    Right lower extremity:  Exam:   Inspection: no gross deformity, no open wounds, no swelling, noerythema, no drainage. Intact, healed old lateral hip incision.  Vascular: palpable dorsalis pedis pulse, toes warm  and well perfused, capillary refill < 2s  Motor function: intact HF/KE/KF/DF/PF/EHL, 5/5 strength  Sensory: S/S/SP/DP/T intact to light touch    Secondary Survey:  Other extremities and axial skeleton appear uninjured and patient denies pain or tenderness with palpation or motion.    LABS:  CBC:   Lab Results   Lab Name Value Date/Time    WBC 7.0 07/06/2020 08:19 PM    HGB 11.7 (L) 07/06/2020 08:19 PM    HCT 34.0 (L) 07/06/2020 08:19 PM    PLT 189 07/06/2020 08:19 PM     CHEM7:   Lab Results   Lab Name Value Date/Time    NA 139 07/06/2020 07:13 PM    K 4.3 07/06/2020 07:13 PM    CL 105 07/06/2020 07:13 PM    CO2 21 (L) 07/06/2020 07:13 PM    BUN 15 07/06/2020 07:13 PM    CR 0.94 07/06/2020 07:13 PM    GLU 130 (H) 07/06/2020 07:13 PM     INR:   Recent labs for the past 24 hours     07/06/20  1913   INR 0.96      IMAGING:  X-ray Results: right periprosthetic hip fracture at proximal femur    PROCEDURES  none    ASSESSMENT:  Cristina Carter is a 80yrold [facility] We recommend operative stabilization of fracture. This was discussed with the patient utilizing a Spanish Interpretor. She has consented to surgery. We have asked Medicine Hospitalist Team to assist [facility]      PLAN:    Right Periprosthetic Hip Fracture    - Treatment provided: assessment.   - Recommendations: surgical stabilization. Hospitalist co-management/surgical risk stratification consult. NPO MN  - Weight bearing status: NWB  - Antibiotics: OCTOR    Orthopaedic Trauma Contact List:     Primary/1st Call:   FOR PATIENTS ON D14 AND D11 PLEASE PAGE: NP [phone] (7d/week 7am-6pm)  FOR PATIENTS ON ALL OTHER FLOORS PLEASE PAGE: NP [phone] (7d/week 7am-6pm)  2nd Call: Randhawa [phone]   3rd Call: SMarlene [phone]  4th Call: KGeorgina [phone]  Cross-cover: [phone] Cross-cover is primary contact 7d/week 6pm-7am  (This service has an in house resident covering all orthopedic patients.  Please limit pages between the hours of 6pm-6am to critical issues)    PValentino Nose WCristela Blue M.D.  PGY-2, [place] [facility]. of Orthopaedic Surgery  [facility]  Pager# [phone]  PI# [place] MSN-FNPc PI [id number]  Orthopedic Surgery Services.     I saw and evaluated the patient and agree with the NPs note/plan as above. Patient with right periprosthetic femur fracture with stable femoral stem of THA indicated for ORIF. Met with patient in pre op area with [facility] interpreter. I explained the diagnosis and treatment to the patient. We reviewed the planned procedure including risks/benefits. Risks include but are not limited to bleeding, infection, injury to blood vessels or nerves, painful hardware, hardware failure, non union, malunion, need for reoperation, DVT, PE, other unforeseen complications including death. Patient expressed understanding of above and had questions answered. Consent scanned into chart.      Quentin Angst, MD

## 2020-07-06 NOTE — Progress Notes (Signed)
TRAUMA CHIEF 69 PROGRESS NOTE    80yo female s/p mechanical GLF found to have right periprosthetic hip fracture. Ortho consulted. Will admit to Ortho with trauma gold for tertiary.      I discussed the patient's care and plan with Trauma Attending Dr. Daneil Dolin at 2130 on 07/06/2020.      Erick Colace, MD  General Surgery  Trauma Chief Pager #: [phone]

## 2020-07-06 NOTE — H&P (Signed)
TRAUMA ADMISSION HISTORY AND PHYSICAL NOTE  Age: 38yrTrauma Type: [id number]  Note Date and Time: 07/06/2020    19:04  Date of Admission: 07/06/2020   Date of Service: 07/06/2020  Patient's PCP: Patient, No Pcp Per          CODE STATUS:  Full     CHIEF COMPLAINT:  Hip pain s/p fall     HISTORY OF PRESENT ILLNESS:  Cristina Trembleis a 89yrld woman with history of HTN and DM brought in by ambulance [facility] post trip and fall over water bottle with Chief Complaint of right hip pain. [place]     Context     Mechanism of injury: Fall: mechanical ground level fall  Location: Pelvis   Duration of symptoms: Today at 1500  Seat belted: N/A  Loss of consciousness: no  Hypotension prior to arrival: N/A  Helmeted: N/A  Mode of transport: Ambulance  Transferred: Yes: [facility]  HISTORY:      Past Medical History:   Diagnosis Date    Chronic kidney disease (CKD), stage IV (severe) (HCC[facility]    Diabetes mellitus ([facility]    Hypertension        Past Surgical History:   Procedure Laterality Date    EGD  10/14/2018    ERCP  10/14/2018       Social History:  Tobacco: denies  Recreational/illicit drug use: denies  Alcohol Use: denies  Marital Status: single  DPOA/Conservator: self  Residence: House    Family History:  Evaluated and determined to be non-contributory to injury     The patient's past medical, family, and social history was reviewed and confirmed.    ALLERGIES:  No Known Allergies    MEDICATIONS:  No current facility-administered medications on file prior to encounter.     Current Outpatient Medications on File Prior to Encounter   Medication Sig Dispense Refill    Amlodipine (NORVASC) 5 mg Tablet Take 5 mg by mouth. Indications: high blood pressure      atorvastatin calcium (ATORVASTATIN PO) Take 10 mg by mouth every day at bedtime.      Losartan (COZAAR) 50 mg Tablet Take 100 mg by mouth every day.         ROS  - good historian  Constitutional: Negative    Head: No head  trauma  or headache  Eyes: No changes in vision   Ears, Nose, Mouth, Throat: No facial pain or trauma  CV: No chest pain or chest trauma   Resp: No SOB   GI: No abdominal pain , N/V, no hematemesis, hematochezia, melena  GU: No GU trauma or dysuria   Back: No back trauma or back pain   Neuro: No dizziness, light-headedness, numbness, tingling, or weakness   MSK: + right hip pain   Integumentary: No injury, No rashes   Psychiatric: No psychiatric concerns   Endocrine: + diabetes, No thyroid disorder   Hematologic/Lymphatic: No bleeding disorders   Allergic/Immunologic: No allergies     VITAL SIGNS:   Temp: 37 C (98.6 F) (07/06/20 2025)  Temp src: Oral (07/06/20 2025)  Pulse: 79 (07/06/20 2240)  Body mass index is 31.69 kg/m.  Body surface area is 1.77 meters squared.    GLASGOW COMA SCALE:   Eye Opening: Spontaneous - 4  Verbal Response: Oriented - 5  Motor Response: Obeys Command - 6  Total: 15    PHYSICAL EXAM:  Airway: Intact  Head: normocephalic, atraumatic, no cephalohematomas, no palpable fractures   Eyes: PERRL, EOMI without pain. No abrasions or ecchymoses   Ear, Nose, Throat: TMs clear bilaterally, no blood in naso/oropharynx        - any facial grimace asymmetry noted?  no  Neck: + midline tender without stepoffs, Trachea midline and supple   Respiratory: Equal Breath Sounds, Chest wall non tender, no crepitus or instability   Cardiovascular:  Regular Rate and Rhythm  Abdominal: Non-tender and soft   Pelvis:  Pubic symphysis stable   Genital Exam: Genital Exam Normal  Rectal Exam: deferred  Back: + lumbar TTP Without Stepoffe   Neurological: CNS Normal and no focal deficits   Extremities: right hip TTP, right knee TTP   Pulses: 2+ bilateral radial, 2+ bilateral dorsalis pedis, 2+bilateral posterior tibial, 2+ bilateral femoral    LABS:   Recent labs for the past 72 hours     07/06/20  1913 07/06/20  2019 07/06/20  2316   HCT 34.9* 34.0* 33.3*  33.3*     Recent labs for the past 72 hours      07/06/20  1913 07/06/20  2019 07/06/20  2316   WBC 7.7 7.0 6.7  6.7     Recent labs for the past 72 hours     07/06/20  1913   PT 8.9   INR 0.96     Recent labs for the past 72 hours     07/06/20  1913   NA 139   K 4.3   CL 105   CO2 21*   CR 0.94   BUN 15   GLU 130*   CA 9.3   TP 7.1   ALB 4.5   TBIL 0.5   ALP 85   ALT 19   BILID <0.2     Recent labs for the past 72 hours     07/06/20  1913   VENPO2 32   VENO2SAT 60*   VENPCO2 48   VENPH 7.34   VENHCO3 26   VENBE 0     No results for input(s): UACOLOR, UACLARITY, UAOCCULT, UAPRO, UANIT, UALEUK, UAMWBC, UAMRBC, BACTERIA, UASQEPI in the last 72 hours.    TOX SCREEN:  No results for input(s): BARBSSCRNU, BENZOSSCRNU, COCAINESCRNU, OPIATESSCRU, AMPHETSCRNU, DRUGGCSCRNU in the last 72 hours.      Serum blood alcohol - non forensic  Lab Results   Component Value Date    ETOH <10 07/06/2020       IMAGING:   All studies preliminary unless noted.      RADIOLOGY RESULTS:  DX CHEST 1 VIEW  No evidence of acute traumatic injury.    CT C-SPINE WITHOUT CONTRAST  There is no acute fracture or subluxation of the cervical spine.    CT T-SPINE (2D RECONS T-SPINE FROM CHEST)  There is no acute fracture or subluxation of the thoracic spine.    CT L-SPINE (2D RECONS L-SPINE FROM ABD/PELVIS)  No acute fracture or post-traumatic malalignment of the lumbar  spine.    CT ABDOMEN + PELVIS WITHOUT CONTRAST  Right periprosthetic minimally displaced proximal femoral shaft  fracture. No additional acute intra-abdominal traumatic injury.     SHOULDER 3+ VIEWS RIGHT  No acute osseous abnormality.    HUMERUS, RIGHT  No acute osseous abnormality.    ELBOW 3+ VIEWS, RIGHT  No acute osseous abnormality.  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    CT CHEST WO CONTRAST  No evidence of acute  intrathoracic trauma within limitations of lack  of IV contrast.    DX PELVIS OUTSIDE IMAGES 2ND INTERPRETATION  Pending read    DX LOWER EXT OUTSIDE IMAGES 2ND INTERPRETATION  Pending read    DX  LOWER EXT OUTSIDE IMAGES 2ND INTERPRETATION  Pending read    LAB TESTS/STUDIES:   I personally reviewed the imaging and labs as stated above from admission.     ASSESSMENT AND PLAN   List of Injuries Assessment & Plan   Head/Neuro  Denies head strike, no LOC. GCS 15.   EENT  Face atraumatic   Cervical Spine  CT C-spine shows no acute fracture or subluxation of the cervical spine.   C-spine cleared with clinical exam.    CV  Placed on bedside cardiac monitor   HEME  Hemodynamically stable. Serial CBC.      Chest/Respiratory  Stable on room air with normal respiratory rate  CXR shows no trauma  CT Chest shows no evidence of acute intrathoracic trauma      Thoracic Spine  CT imaging shows no T spine fractures.    Abdomen/Pelvis  Serial exams yield soft, nontender abdomen, pelvis.    CT Abd/Pelvis demonstrate Right periprosthetic minimally displaced proximal femoral shaft  fracture.    Lumbar Spine  CT L-spine shows No acute fracture or post-traumatic malalignment of the lumbar  spine.     Musculoskeletal Right periprosthetic hip fracture Xrays show Right periprosthetic minimally displaced proximal femoral shaft fracture. Ortho consulted, appreciate recommendatsion   Integumentary/Wounds  No wounds     Psychiatric Concerns  No acute psychiatric needs       Nutrition/PO Status NPO pending Plan   Endocrine Diabetic, carb controlled diet when able, ISS while inpatient   GU/Renal/Fluids CKD stage 4   ID Tetanus: not indicated  Antibiotics: perioperative  Urinalysis: pending   Anticoagulation Ok to start DVT from a trauma standpoint   ETOH Serum BAL negative, no intervention.   Conditions of Chronic Disease CKD, DM, HTN, obesity         INCIDENTAL FINDINGS:  Hydropic gallbladder with cholelithiasis. No evidence of  cholecystitis.  Colonic diverticulosis without evidence of diverticulitis.  Blunting of the left costophrenic angle may reflect a small left  pleural effusion, or a prominent epicardial fat pad.  Borderline  cardiomegaly.      CONSULTS:  See EMR Consult order time stamps.   I consulted Orthopedics at 20:15 regarding periprosthetic hip fracture.    DISPOSITION:   Trauma Gold (Pager# [phone]) for tertiary, orthopaedics primary    Admitting team contacted for admission orders at 20:15.     PRESENT ON ADMISSION:  Are any of the following five conditions present or suspected on admission: decubitus ulcer, infection from an intravascular device, infection due to an indwelling catheter, surgical site infection or pneumonia? No.      Report Electronically Signed By:   Cristina Carter. Cristina Gianotti, MD  Orthopaedic Surgery, PGY-1  PI: [phone], Pager: [phone]  [facility]       Date: 07/06/2020 Time: 23:49     Trauma/Acute Care Surgery Attending Progress Note  This patient was discussed with, evaluated, and care plan was developed with Dr. Lanier Carter, chief resident on the [facility] Surgery team. I agree with the assessment and plan as outlined in the resident's note with the following additions:     80 yo female transferred from OSH s/p mGLF and subsequent Rt periprosthetic femur fx.  No other acute injuries noted.  She will be  admitted to Ortho for further management.    Cristina Dawley Daneil Dolin, MD  Professor of Surgery  [phone]  07/07/2020

## 2020-07-06 NOTE — Consults (Signed)
ORTHOPEDIC CO-MANAGEMENT SERVICE INITIAL CONSULT NOTE    DATE OF ADMISSION:  07/06/2020   Primary Care: [facility], Phone: [phone]  Consulting Physician/Service: Orthopedic Trauma Surgery    REASON FOR CONSULT: Medical optimization    HISTORY OF PRESENTING ILLNESS:    Cristina Carter is a 80yrold female patient with history of HTN presenting with R hip fracture.    She reports that she has been in her usual state of health, but fell when she was moving some water bottles and tripped. She denies any preceding lightheadedness, chest pain, shortness of breath, or other symptoms. She normally walks with a walker and walks every day around the block without any chest pain or shortness of breath. She notes that she has not had any changes to her exercise tolerance recently. She is not currently having any pain if she doesn't move her leg and is eager to know if she will get surgical intervention here during this stay. She lives with her son.    She denies any abdominal pain, N/V. She has a history of GERD symptoms which are chronic and unchanged.     With prior surgeries, the patient does not think she has had any complications.    ROS:   Constitutional: negative.  Eyes: negative.  Ears, Nose, Mouth, Throat: negative.  CV: negative.  Resp: negative.  GI: heartburn or reflux.  GU: negative.  Musculoskeletal: negative.  Integumentary: negative.  Neuro: negative.    HISTORY       Past Medical History:   Diagnosis Date    Chronic kidney disease (CKD), stage IV (severe) (HCC)     Diabetes mellitus ([facility]     Hypertension       Past Surgical History:   Procedure Laterality Date    EGD  10/14/2018    ERCP  10/14/2018      Social History     Socioeconomic History    Marital status: MARRIED     Spouse name: Not on file    Number of children: Not on file    Years of education: Not on file    Highest education level: Not on file   Occupational History    Not on file   Tobacco Use    Smoking status: Never  Smoker    Smokeless tobacco: Never Used   Substance and Sexual Activity    Alcohol use: Never    Drug use: Never    Sexual activity: Not on file      Family History   Problem Relation Name Age of Onset    No Known Problems Mother      No Known Problems Father         The history section was last reviewed by NDonell Beers NP on Jul 06, 2020.    ALLERGIES   No Known Allergies    PRIOR TO ADMISSION MEDICATIONS    Prior to Admission Medications   Prescriptions Last Dose Informant Patient Reported? Taking?   Amlodipine (NORVASC) 5 mg Tablet   Yes No   Sig: Take 5 mg by mouth. Indications: high blood pressure   Losartan (COZAAR) 50 mg Tablet   Yes Yes   Sig: Take 100 mg by mouth every day.   atorvastatin calcium (ATORVASTATIN PO)   Yes No   Sig: Take 10 mg by mouth every day at bedtime.      Facility-Administered Medications: None         VITALS    Summary  Temp  Min: 36.9 C (98.4 F) Max: 37 C (98.6 F)  BP: (130-156)/(78-105)   Pulse Min: 81 Max: 89  Resp Min: 16 Max: 19  SpO2 Min: 94 % Max: 96 %       Current Vitals  Temp: 37 C (98.6 F)  BP: 156/84  Pulse: 81  Resp: 16  SpO2: 96 %      Weight: 73.6 kg (162 lb 4.1 oz)  Body surface area is 1.77 meters squared.  Body mass index is 31.69 kg/m.    PHYSICAL EXAM:    General appearance:  laying in bed, no apparent distress  HEENT: extraocular movements intact, moist mucous membranes  CV: Regular rate and rhythm. Normal S1, S2. No appreciated murmurs, rubs, or gallops.   Pulm: Lungs are clear to auscultation bilaterally.  Normal respiratory effort.   Abd: Normoactive bowel sounds. Abdomen is soft, non-distended, and non-tender to palpation in epigastrium, minimally tender to deep palpation in suprapubic region. No rebound or guarding. No appreciated organomegaly.  Extremities: All four extremities were examined and were warm, and well-perfused.  Skin: No rashes or jaundice appreciated  Mental Status: Awake and alert. Responding appropriately to  questions.     LABORATORY STUDIES:  CBC:  Recent labs for the past 48 hours     07/06/20  1913 07/06/20  2019   WBC 7.7 7.0   HGB 12.2 11.7*   HCT 34.9* 34.0*   PLT 192 189      Chemistry:  Recent labs for the past 48 hours     07/06/20  1913   GLU 130*   BUN 15   CR 0.94   NA 139   K 4.3   CL 105   CO2 21*   CA 9.3      Recent labs for the past 48 hours     07/06/20  1913   TBIL 0.5   AST 21   ALP 85   ALB 4.5   TP 7.1      Coags:  Recent labs for the past 48 hours     07/06/20  1913   INR 0.96       =====================================  ASSESSMENT / RECOMMENDATIONS:       This surgery is urgent and patient is medically optimized for the proposed procedure and requires no further medical evaluation at this time.  Please discuss with anesthesia that spinal anesthesia is preferred if appropriate and safe:        Summary of Recommendations:     1. Medication reconciliation completed  2. Medications (orders placed for you): Hold home ACEI/ARB Losartan morning of surgery. Plan to restart POD#1   3. Will follow up Vit D level to determine Ca/Vit D supplementation for osteoporosis  4. Elevated lipase not concerning given lack of abdominal pain and lack of imaging abnormalities- no further work up for this    Other perioperative considerations:  -Wound Care per surgical team vs. wound care consultation  -Antibiotic Stewardship, narrow when possible and follow up culture data  -Removal of foley ASAP  -Early mobilization with physical therapy evaluation  -Aggressive incentive spirometry is recommended perioperatively    Problem List:     #R Periprosthetic hip fracture  Mechanical fall. Prior surgery in [place] 15y ago, defer to ortho for management    #Elevated lipase  No clinical correlation-- no abd pain, no imaging abnormalities.   - No further work up    #Osteoporosis  #Fragility fracture  #Pathologic Fracture  -Patients 82  years of age or older with hip or spine fragility fracture should be treated for osteoporosis per  national guidelines (ASBMR).   -Upon discharge, Hospitalist will :   -Prescribe supplements/ pharmacotherapies when appropriate: calcium, vitamin D, bisphosphonates    -Physical therapy outpatient (SNF vs. HH vs. Outpatient)   -Endocrinology Referral at discharge if indicated   -[facility] Referral at discharge if: the patient is geriatric with falls AND has a PCP at [facility] can visit the clinic in [place], [place] patient education with dot phrase .[id number] in patient discharge instructions  -PCP should order baseline DEXA scan outpatient if not completed in the past year  -PCP should order bone health labs (Ca, Vit D) 3 months after discharge  - Based on Vit D level, will order supplementation     #HTN- continue amlodipine and HOLD losartan in AM in anticipation of OR, restart losartan POD1    #Anemia, normocytic- mild and no signs of bleeding, appears stable but will monitor post op      -----------------------------------------------------------------  The patient is ASA (American Society of Anesthesiologists) class 2.     Cardiovascular Risk:  The patient does not have any active cardiac conditions that would preclude her from surgery as defined by the [facility] of Cardiology as Unstable ACS, Decompensated CHF, life-threatening arrhythmias or severe Valvular disease.     Perioperative risk:   The risk of major adverse cardiac event (MACE) intra-operatively to up to 30-days post operatively per Lyndel Safe is Lyndel Safe Total Risk Score (%): 0.2 The risk of perioperative inpatient cardiac death, non-fatal MI, and non-fatal cardiac arrest per the [facility] is 0.4%.     Major risk factors include: none. Thus, based on the 2014 ACC/AHA guidelines for perioperative cardiovascular evaluation in noncardiac surgery, further risk stratification with cardiac stress testing is not indicated.   Further testing will not affect perioperative care and the patient should  proceed to surgery  without further risk factor modification.    Pulmonary Risk:  The patient is at Low risk of post-op pulmonary complications based on their ARISCAT score. The patient should be given aggressive incentive spirometry pre- and post-operatively and early mobilization post-op is encouraged. Her underlying pulmonary conditions are optimized. Major patient-related risk factors for pulmonary complication include obesity and Age > 70 y/o.    OSA Screening:   1. Do you snore loudly (louder than talking or loud enough to be heard through closed doors)? no  2. Do you often feel tired, fatigued, or sleepy during daytime? no  3. Has anyone observed you stop breathing during your sleep? no  4. Do you have or are you being treated for high blood pressure? yes  5. BMI more than 35 kg/m2? Body mass index is 31.69 kg/m.   13. Age over 80 yr old? 12yr  7. Neck circumference greater than 40 cm/16 in? no  8. Gender female? female      The patient is at Low risk . If high risk, consider pre- and post-op CPAP, regional anesthesia for postop pain control, minimizing postop sedatives, and close monitoring of O2 sat with low threshold to provide supplemental oxygen.     Cirrhosis Risk:  The patient does not have a history of liver disease or current signs of acute liver failure.       Delirium Risk:  -Patient is at Moderate risk for delirium, based on age >>90 If high risk, consider using Geriatric (or High Risk) Delirium  Prevention/Treatment Panel.    Medication Considerations:   ACEI/ARB: Hold morning of surgery    CHECKLIST:  FEN: REGULAR DIET  NPO, AFTER MIDNIGHT  Pain control: Per Ortho   Osteoporosis treatment: Follow up Vit D and Ca levels and Outpatient DEXA     DVT Prophylaxis: Per ortho  Bowel Regimen: Per Ortho   PT/OT: Early mobilization post-op  Disposition: Pending PT/OT eval post-op       >50% of a 55 minute  encounter was spent on counseling the patient and/or family, and coordination of care for the problems discussed in my  note.     Report Electronically Signed by: Moishe Spice, [facility]    Date: 07/06/2020 Time: 22:07

## 2020-07-06 NOTE — ED Triage Note (Signed)
[facility]  Ground level fall yesterda  Right hip fx by prostetic hip  Neg loc no other injuries  gcs 15  Eta 1900 hrs  18g left ac.  Denies pain when not moving.

## 2020-07-06 NOTE — Progress Notes (Signed)
C Spine Clearance Progress Note    Neck flexion: Nontender  Neck extension: Nonteder  Rotate head to right: Nontender  Rotate head to left: Nontender  Palpation of cervical spine: Nontender     Pt was nontender, had full ROM, and had no peripheral symptoms of neurological compromise on exam.      Cervical spine cleared after negative imaging and negative clinical exam.    Signed,   Marthella Osorno A. Myra Gianotti, MD  Orthopaedic Surgery, PGY-1  PI: [phone], Pager: [place]

## 2020-07-06 NOTE — ED Provider Notes (Signed)
EMERGENCY DEPARTMENT PHYSICIAN NOTE - Cristina Carter          Date of Service:   07/06/2020  7:04 PM Patient's PCP: Patient, No Pcp Per   Note Started: 07/06/2020 19:07 DOB: 1940-12-31          Chief Complaint   Patient presents with    *922:Blunt/Critical Trauma Level II       The history provided by the patient and EMS personnel.  Interpreter used: Yes Other- staff ([place])    Cristina Carter is a 80yrold female, who  has a past medical history of Chronic kidney disease (CKD), stage IV (severe) ([place], Diabetes [place] ([place], and Hypertension., BIBA to ED with a chief complaint of R hip pain s/p mechanical GLF that occurred yesterday.    Patient reportedly tripped over a water bottle and landed onto her R-side. Denies head trauma or LOC. Not on blood-thinners. Primarily endorsing R hip and R knee pain. Initial OSH imaging revealed a R periprosthetic hip fracture, [facility]. Received '4mg'$  morphine '@6'$ :10pm prior to transfer. Stable vitals en-route. However, upon ED arrival, she also reported new neck pain. Denies numbness, tingling, or weakness.     A full history, including past medical, social, and family history (as detailed in this note), was reviewed and updated as necessary.      HISTORY:    Past Medical History    Chronic kidney disease (CKD), stage IV (severe) (HCC)    Diabetes mellitus ([place]    Hypertension    No Known Allergies   Past Surgical History:  10/14/2018: Egd  10/14/2018: Ercp   Current Outpatient Medications:     Amlodipine (NORVASC) 5 mg Tablet, Take 5 mg by mouth. Indications: high blood pressure    atorvastatin calcium (ATORVASTATIN PO), Take 10 mg by mouth every day at bedtime.    FUROsemide (LASIX) 20 mg Tablet, Take 20 mg by mouth 2 times daily.   Social History     Tobacco Use    Smoking status: Never Smoker    Smokeless tobacco: Never Used   Substance Use Topics    Alcohol use: Never    Drug use: Never     Social History     Social  History Narrative    Not on file    No family history on file.        Review of Systems   Constitutional: Negative for chills and fever.   HENT: Negative for congestion and sore throat.    Respiratory: Negative for cough and shortness of breath.    Cardiovascular: Negative for chest pain and leg swelling.   Gastrointestinal: Negative for abdominal pain, nausea and vomiting.   Genitourinary: Negative for dysuria and frequency.   Musculoskeletal: Positive for arthralgias and neck pain. Negative for myalgias.        + R hip pain   Skin: Negative for rash.   Neurological: Negative for weakness, numbness and headaches.   Psychiatric/Behavioral: Negative for hallucinations and suicidal ideas.          TRIAGE VITAL SIGNS:  Temp: 36.9 C (98.4 F) (07/06/20 1912)  Temp src: Oral (07/06/20 1912)  Pulse: 88 (07/06/20 1914)  BP: 154/78 (07/06/20 1914)  Resp: 18 (07/06/20 1914)  SpO2: 95 % (07/06/20 1914)  Weight: 73.6 kg (162 lb 4.1 oz) (07/06/20 1920)    Physical Exam  Vitals and nursing note reviewed.   HENT:      Head: Normocephalic and atraumatic.  Comments: No cephalohematomas.     Right Ear: Tympanic membrane, ear canal and external ear normal.      Left Ear: Tympanic membrane, ear canal and external ear normal.      Ears:      Comments: No hemotympanum bilaterally.     Nose: Nose normal.      Mouth/Throat:      Mouth: Mucous membranes are moist.      Pharynx: Oropharynx is clear.   Eyes:      Extraocular Movements: Extraocular movements intact.      Pupils: Pupils are equal, round, and reactive to light.   Neck:      Comments: C-spine: No midline or paraspinal tenderness, no step-offs or deformities.  Cardiovascular:      Rate and Rhythm: Normal rate and regular rhythm.      Pulses: Normal pulses.   Pulmonary:      Effort: Pulmonary effort is normal. No respiratory distress.      Breath sounds: Normal breath sounds. No stridor. No wheezing, rhonchi or rales.      Comments: Airway intact.  Bilateral breath  sounds  No chest wall tenderness or crepitus.  Chest:      Chest wall: No tenderness.   Abdominal:      General: Abdomen is flat. There is no distension.      Palpations: Abdomen is soft.      Tenderness: There is no abdominal tenderness.      Comments: Soft and non-tender.   Musculoskeletal:         General: Tenderness present. Normal range of motion.      Cervical back: Neck supple. No tenderness.      Comments: T-spine: No midline or paraspinal tenderness, no step-offs or deformities.   L-spine: Midline tenderness, but no obvious step-offs or deformities.     Pelvis is stable, but tenderness to R hip.   Old surgical scar overlying R hip.    RUE: No obvious tenderness, no deformities, ROM intact, 2+ radial pulse, sensation intact  LUE: No obvious tenderness, no deformities, ROM intact, 2+ radial pulse, sensation intact  RLE: No obvious tenderness, no deformities, ROM intact, 2+ dp pulse, sensation intact  LLE: No obvious tenderness, no deformities, ROM intact, 2+ dp pulse, sensation intact   Skin:     General: Skin is warm and dry.   Neurological:      General: No focal deficit present.      Mental Status: She is alert and oriented to person, place, and time.      Comments: GCS 15   Psychiatric:         Mood and Affect: Mood normal.         Behavior: Behavior normal.           MEDICAL DECISION MAKING  Differential includes, but is not limited to: Intracranial injury, Spinal injury, Intra-thoracic injury, intra-abdominal injury, orthopedic injury.        The results of the ED evaluation were notable for the following:   Pertinent lab results:   Labs Reviewed   CBC NO DIFFERENTIAL - Abnormal       Result Value    White Blood Cell Count 7.7      Red Blood Cell Count 3.76 (*)     Hemoglobin 12.2      Hematocrit 34.9 (*)     MCV 92.7      MCH 32.5      MCHC 35.1  RDW 13.2      MPV 9.0      Platelet Count 192     BASIC METABOLIC PANEL - Abnormal    Sodium 139      Potassium 4.3      Chloride 105      Carbon Dioxide  Total 21 (*)     Urea Nitrogen, Blood (BUN) 15      Creatinine Serum 0.94      Glucose 130 (*)     Calcium 9.3      E-GFR (Female) 57     LIPASE - Abnormal    Lipase 458 (*)    APTT STUDIES - Abnormal    aPTT 28.4 (*)    CBC NO DIFFERENTIAL - Abnormal    White Blood Cell Count 7.0      Red Blood Cell Count 3.67 (*)     Hemoglobin 11.7 (*)     Hematocrit 34.0 (*)     MCV 92.8      MCH 31.9      MCHC 34.4      RDW 13.4      MPV 8.7      Platelet Count 189     BLD GAS VENOUS - Abnormal    Patient Temp, Ven        PO2, Ven 32      O2 Sat, Ven 60 (*)     PCO2, Ven 48      pH, VEN 7.34      HCO3, Ven 26      Base Excess, Ven 0     HEPATIC FUNCTION PANEL - Normal    Protein 7.1      Alkaline Phosphatase (ALP) 85      Aspartate Transaminase (AST) 21      Bilirubin Total 0.5      Alanine Transferase (ALT) 19      Bilirubin Direct <0.2      Albumin 4.5     INR - Normal    Prothrombin Time 8.9      INR 0.96     ETHANOL, PLASMA - Normal    Ethanol, Plasma <10     URINALYSIS-COMPLETE   UR DRUGS OF ABUSE SCREEN   CBC NO DIFFERENTIAL       Pertinent imaging results (reviewed and interpreted independently by me):   Chest X-Ray: borderline cardiolemegaly    Radiology reads:   CT CHEST WO CONTRAST    Result Date: 07/06/2020  CT CHEST WO CONTRAST EXAM DATE: 07/06/2020 7:51 PM COMPARISON: Same day chest radiograph INDICATION: Signs/Symptoms:  fall yesterday TECHNIQUE: CT scan of the chest was performed without the use of intravenous contrast. Coronal, sagittal, and maximum intensity projection images were reformatted. DOSE REPORT: This study involved (1) CT acquisition(s).  The CTDIvol and DLP values are included below as required by state law: 1;  Series: 2; Chest32 cm;  CTDIvol: 24.9 mGy;  DLP: 1615 mGy-cm For further information on CT radiation dose, see [email or web address] FINDINGS: LOWER NECK AND CHEST WALL: Normal. MEDIASTINUM AND HILA: No mediastinal lymphadenopathy by CT size criteria.  Evaluation for hilar lymphadenopathy is limited by lack of IV contrast. Esophagus unremarkable. CARDIOVASCULAR: Trivial pericardial effusion. Heart is normal in size. Scattered coronary artery and aortic valve calcifications. Great vessels are normal in caliber. Minimal atherosclerotic calcification of the aortic arch and its branch vessels. Evaluation for traumatic vessel injury is limited without IV contrast. LUNGS, AIRWAYS, AND PLEURA: Central airways are patent. No pneumothorax or pleural effusion. Mild  dependent subsegmental atelectasis. No focal consolidations. A few scattered nodules measuring up to 2 mm (series 5 image 94) likely represent small granulomas or intraparenchymal lymph nodes. No suspicious pulmonary nodules. UPPER ABDOMEN: Reported separately. BONES: Remote fracture of the anterolateral left fifth rib (series 5 image 115). Moderate multilevel degenerative changes of the spine. Flowing osteophyte formation across multiple thoracic vertebral bodies with relative preservation of the disc spaces consistent with diffuse idiopathic skeletal hyperostosis. IMPRESSION: 1. No evidence of acute intrathoracic trauma within limitations of lack of IV contrast. *THIS STUDY HAS NOT BEEN REVIEWED BY AN ATTENDING RADIOLOGIST* Preliminary Report Electronically Signed By: Micheline Maze on 07/06/2020 8:08 PM    CT C-SPINE WITHOUT CONTRAST    Result Date: 07/06/2020  CT C-SPINE WITHOUT CONTRAST EXAM DATE: 07/05/2020 COMPARISON: None. INDICATION: Special Instructions: Trauma. TECHNIQUE: 1.25 mm axial CT images through the cervical spine with sagittal and coronal reformatted images. DOSE REPORT: This study involved CT acquisition(s).  The CTDIvol and DLP values are included below as required by state law: CTDIvol= 17.5 mGy; DLP 308.34 mGy-cm The dose-related parameters for CT are the volume computed tomography dose index and the dose length product. These parameters are not measures of patient dose per se, but are values  generated from the CT scanner acquisition factors. The reported values may underestimate or overestimate the actual absorbed dose to the patient depending on patient size and other factors. For further information, see [email or web address] FINDINGS: Alignment: Grade 1 anterolisthesis of C2 over C3 of 1 to 2 mm. Vertebrae: No acute fracture or destructive changes. Multilevel cervical spondylosis with osteophytes and endplate degenerative changes.  There is no significant bony spinal canal stenosis of traumatic origin. Reduced heights of C5-6 and C6-7 and to lesser extent C4-5 intervertebral disc spaces consistent with degenerative disc disease associated with multilevel disc osteophyte complexes resulting in varying grades of spinal canal stenosis most severe at C6-7 level. Uncovertebral and facet arthropathy contributing to varying grades of neural foraminal compromise predominantly C4-5 to C6-7 levels. Prevertebral and paraspinal soft tissues: Left thyroid lobe calcified subcentimeter focus, nonspecific. Incidental note of bony exostosis at the right cerebellopontine angle (series 3 image 13). IMPRESSION: 1. There is no acute fracture or subluxation of the cervical spine. Final Report Electronically Signed By: Remer Macho on 07/06/2020 7:53 PM    ELBOW 3+ VIEWS, RIGHT    Result Date: 07/06/2020  ELBOW 3+ VIEWS, RIGHT EXAM DATE: 07/06/2020 7:57 PM COMPARISON: None available INDICATION: Signs/Symptoms or Diagnosis: Pain S/P Trauma  Special Instructions: Trauma 3 view TECHNIQUE: Ap, oblique, lateral, and radiocapitellar views FINDINGS: There is no acute fracture or dislocation. Mild irregularity of the radial head favored to be from arthrosis. Corticated ossific density adjacent to the lateral epicondyle likely a remote fracture. Moderate arthrosis of the elbow. There is no focal soft tissue abnormality, including no joint effusion. IMPRESSION: 1. No acute osseous abnormality. 2.  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 - Preliminary Report Electronically Signed By: Micheline Maze on 07/06/2020 8:19 PM I have personally reviewed the images of this study and agree with the above report. Final Report Electronically Signed By: Nori Riis, M.D. on 07/06/2020 8:31 PM    HUMERUS, RIGHT    Result Date: 07/06/2020  HUMERUS, RIGHT EXAM DATE: 07/06/2020 7:57 PM COMPARISON: Same-day CT chest INDICATION: Signs/Symptoms or Diagnosis: Pain S/P Trauma  Special Instructions: Trauma 2 view TECHNIQUE: AP and lateral views of the right humerus FINDINGS: There is no acute fracture or dislocation. Moderate arthrosis of the glenohumeral and elbow  joints. There is no focal soft tissue abnormality. IMPRESSION: 1. No acute osseous abnormality. - Preliminary Report Electronically Signed By: Micheline Maze on 07/06/2020 8:21 PM I have personally reviewed the images of this study and agree with the above report. Final Report Electronically Signed By: Nori Riis, M.D. on 07/06/2020 8:32 PM    SHOULDER 3+ VIEWS RIGHT    Result Date: 07/06/2020  SHOULDER 3+ VIEWS RIGHT EXAM DATE: 07/06/2020 7:57 PM COMPARISON: Same-day CT chest INDICATION: Signs/Symptoms or Diagnosis: Pain S/P Trauma  Special Instructions: Trauma 3 view TECHNIQUE: AP, Grashey, and axillary views of the right shoulder FINDINGS: There is no acute fracture or dislocation. Moderate arthrosis of the acromioclavicular and glenohumeral joints. There is no focal soft tissue abnormality. IMPRESSION: 1. No acute osseous abnormality. - Preliminary Report Electronically Signed By: Micheline Maze on 07/06/2020 8:29 PM I have personally reviewed the images of this study and agree with the above report. Final Report Electronically Signed By: Nori Riis, M.D. on 07/06/2020 8:33 PM    DX CHEST 1 VIEW    Result Date: 07/06/2020  DX CHEST 1 VIEW EXAM DATE: 07/06/2020 7:22 PM COMPARISON: None. INDICATION: Pain S/P Trauma FINDINGS:  Borderline cardiomegaly. No focal consolidation or pulmonary edema. Small left pleural effusion/pleural thickening versus prominent epicardial fat pad. No pneumothorax. The visualized upper abdomen is unremarkable. No acute osseous abnormality. IMPRESSION: 1. No evidence of acute traumatic injury. 2. Blunting of the left costophrenic angle may reflect a small left pleural effusion, or a prominent epicardial fat pad. 3. Borderline cardiomegaly. ATTENDING RADIOLOGIST PRELIMINARY REPORT AGREEMENT: I have reviewed and agree with the most recent preliminary report without modification. Preliminary Report Electronically Signed By: Cleta Alberts, MD on 07/06/2020 7:28 PM I have personally reviewed the images of this study and agree with the above report. Final Report Electronically Signed By: Nori Riis, M.D. on 07/06/2020 7:33 PM    CT ABDOMEN + PELVIS WITHOUT CONTRAST    Result Date: 07/06/2020  CT ABDOMEN + PELVIS WITHOUT CONTRAST EXAM DATE: 07/06/2020 7:51 PM COMPARISON: Concurrent CT chest, CT thoracic spine, CT lumbar spine. INDICATION: Signs/Symptoms:  fall on right hip yesterday TECHNIQUE: CT of the abdomen and pelvis. No IV contrast. No oral contrast was administered. Images were reconstructed in the axial plane and reformatted in coronal and sagittal planes. DOSE REPORT: Please reference concurrent CT chest. FINDINGS: Within limitations of lack of IV contrast: Lower Chest: See separate report for findings in the chest. Liver: Multiple coarse calcific densities likely reflect sequela of remote inflammation. Bile Ducts: Unremarkable. Gallbladder: Enlarged, measuring approximately 10 cm in long axis. Cholelithiasis. No wall thickening or pericholecystic fluid. Pancreas: Unremarkable. Spleen: Unremarkable. Adrenal Glands: Unremarkable. Kidneys: Unremarkable. Normal caliber appendix. GI Tract: Small hiatal hernia. Periampullary duodenal diverticulum measures approximately 2 cm. Colonic diverticulosis  without evidence of diverticulitis. Peritoneal Cavity: No free fluid or free air. Evaluation of the pelvis is somewhat limited by streak artifact from surgical hardware. Bladder: Limited evaluation is unremarkable. Uterus and Ovaries: Calcifications within the uterus may reflect calcified leiomyomas. No adnexal mass. Lymph Nodes: No lymphadenopathy. Major Vascular Structures: Scattered atherosclerotic calcifications. Soft Tissues: Unremarkable. Musculoskeletal: Minimally displaced right proximal periprosthetic shaft fracture. Scattered degenerative changes about the pelvis and lumbar spine including Baastrup's kissing spine disease, posterior disc osteophyte complex at L4-5 less so at L5-S1. IMPRESSION: 1. Right periprosthetic minimally displaced proximal femoral shaft fracture. 2. No additional acute intra-abdominal traumatic injury. 3. Hydropic gallbladder with cholelithiasis. No evidence of cholecystitis. 4. Colonic diverticulosis without evidence of diverticulitis. Incidental finding(s)  with recommendation for non-urgent follow-up imaging: NO Preliminary Report Electronically Signed By: Cleta Alberts, MD on 07/06/2020 8:14 PM I have personally reviewed the images of this study and agree with the above report. Final Report Electronically Signed By: Cleta Alberts on 07/06/2020 8:35 PM    CT L-SPINE (2D RECONS L-SPINE FROM ABD/PELVIS)    Result Date: 07/06/2020  CT L-SPINE (2D RECONS L-SPINE FROM ABD/PELVIS) EXAM DATE: 07/06/2020 7:51 PM COMPARISON: Concurrent CT abdomen pelvis, CT thoracic spine. INDICATION: Signs/Symptoms or Diagnosis: Pain S/P Trauma  Special Instructions: Reconstruct L Spine TECHNIQUE: Data set acquired during CT abdomen and pelvis exam was retro-reconstructed in bone windows in the axial, sagittal and coronal planes. DOSE REPORT: Data set was acquired via reformat from CT abdomen/pelvis without additional radiation dose to the patient. FINDINGS: Alignment: There is normal alignment of the  spine. Vertebrae: No acute fractures or destructive changes. There is no significant spinal canal stenosis. Decreased mineralization diffusely consistent with osteopenia/osteoporosis. Prevertebral and paraspinal soft tissues: Normal. Moderate multilevel degenerative changes, most conspicuous at the level of L4-5 and L5-S1 where partially calcified posterior disc bulges result in mild spinal canal narrowing. There is moderate left neural foraminal narrowing at L5-S1. IMPRESSION: 1. No acute fracture or post-traumatic malalignment of the lumbar spine. Preliminary Report Electronically Signed By: Cleta Alberts, MD on 07/06/2020 8:16 PM I have personally reviewed the images of this study and agree with the above report. Final Report Electronically Signed By: Cleta Alberts on 07/06/2020 8:37 PM    CT T-SPINE (2D RECONS T-SPINE FROM CHEST)    Result Date: 07/06/2020  CT T-SPINE (2D RECONS T-SPINE FROM CHEST) EXAM DATE: 07/06/2020 7:51 PM COMPARISON: Same-day CT chest INDICATION: Signs/Symptoms or Diagnosis: Pain S/P Trauma  Special Instructions: Reconstruct T Spine TECHNIQUE: 1.25 mm axial CT images through the thoracic spine with sagittal and coronal reformatted images. DOSE REPORT:  Please see the concurrent CT chest FINDINGS: Alignment: There is normal alignment of the spine. Vertebrae: No acute fracture or destructive changes. Multilevel thoracic spondylosis with osteophytes and endplate degenerative changes are noted. Flowing osteophyte formation across multiple thoracic vertebral bodies with relative preservation of the disc spaces consistent with diffuse idiopathic skeletal hyperostosis.  Prevertebral and paraspinal soft tissues: Please see the concurrent CT chest. . IMPRESSION: 1. There is no acute fracture or subluxation of the thoracic spine. *THIS STUDY HAS NOT BEEN REVIEWED BY AN ATTENDING RADIOLOGIST* Preliminary Report Electronically Signed By: Micheline Maze on 07/06/2020 8:11 PM          Consults:  ORTHOPEDIC ER  [place]  Reason for consult: right hip peri-prosthetic fracture.    ED Medication Administration through 07/06/2020 2126     Date/Time Order Dose Route Action    07/06/2020 2014 Acetaminophen (OFIRMEV) IV 1,000 mg 1,000 mg IV New bag/syringe          Chart Review: I reviewed the patient's prior medical records. Pertinent information that is relevant to this encounter:     ED encounter at [facility] ED today: Indicating mechanical GLF.    "Right knee x-ray is unremarkable, right hip x-ray shows a likely periprosthetic fracture."          PATIENT SUMMARY:   Cristina Carter is a 74yrfemale  who arrives as a 922 trauma transfer following a right hip periprosthetic fracture. Primary emergency department care and resuscitation efforts led by trauma team, please refer to their note for full details.      The patient  did not lose consciousness, has had a GCS of 15 for the duration of the event, has had no nausea or vomiting, or seizure.   C-Spine Clearance   The patient was immobilized in a cervical collar due to risk of cervical spine injury. Radiologic imaging was obtained and was negative for acute fracture/abnormality. The patient was re-examined. Patient is alert, is able to participate in the exam, and is not intoxicated. On exam, there is no neurologic deficit. The patient is able to range her neck laterally, cranially, and caudally with no neurologic deficit. Therefore, the cervical collar was removed.    Abdomen: CT Abdomen Pelvis performed for concerns of intra-abdominal injury. Demonstrated:   Incidental findings of cholelithiasis, and colonic diverticulosis.    Tenderness to right hip. X ray demonstrated: Right periprosthetic minimally displaced proximal femoral shaft fracture.    Pain management provided with Right fascia iliaca block (see procedure note),     Ortho Called for assistance with management and consideration for admission.           LAST  VITAL SIGNS:  Temp: 37 C (98.6 F) (07/06/20 2025)  Temp src: Oral (07/06/20 2025)  Pulse: 81 (07/06/20 2025)  BP: 156/84 (07/06/20 2025)  Resp: 16 (07/06/20 2025)  SpO2: 96 % (07/06/20 2025)  Weight: 73.6 kg (162 lb 4.1 oz) (07/06/20 1920)      Clinical Impression:  Right hip proximal femoral fracture    Disposition: Admit    PATIENT'S GENERAL CONDITION:  Fair: Vital signs are stable and within normal limits. Patient is conscious but may be uncomfortable. Indicators are favorable.    SCRIBE STATEMENT  I, [place],  am personally taking down the notes in the presence of Dr. Tawanna Solo.  Electronically signed by - Morton Peters, [place], Scribe  07/06/2020  19:07    SCRIBE DISCLAIMER   I, Rudi Rummage, MD, personally performed the services described in this documentation, as scribed by the trained medical scribe above in my presence, and it is both accurate and complete.     Electronically signed by: Rudi Rummage, MD, Resident    This patient was seen and evaluated in the emergency department. The care plan was developed with the resident.  I agree with the findings and plan as outlined in our combined note.    Collene Leyden, MD      Electronically signed by: Collene Leyden, MD, Attending Physician      Parts of this note were created using a professional dictation software. Efforts were made to proofread and correct errors. Please pardon any residual errors.

## 2020-07-06 NOTE — ED Procedure Note (Signed)
Bedside Procedure  Performed by: Rudi Rummage, MD  Authorized by: Braulio Conte, MD      PROCEDURE NOTE:    Pre-FICB Checklist:    Consent form completed and signed by patient/family: Emergency    Neurovascular exam documented prior to procedure.    Orthopedic team aware of hip fracture:  yes                    07/06/20                       1915          INR          0.96           INR Normal: yes    Avoid regional block in patients on clopidogrel, ticagrelor, warfarin, and direct oral anticoagulants (eg. Dabigatran).    Fascia [place] Compartment Block (FICB) right NERVE BLOCK Single injection.    Regional Anesthesia of femoral nerve performed by FICB for pain relief for right hip fracture.    Patient neurovascularly intact pre-procedure as documented in EMR.  Procedural pause was performed and correct patient and site confirmed after obtaining informed consent.    Sterile prep performed with chlorhexidine.  Skin anesthetized with local anesthetic lidocaine 1%.    Patient in supine position and awake/conversant.  20 Gauge 3.5 inch length Touhy needle used under direct ultrasound visualiztion.    Local anesthetic ropivacaine 0.2% injected using ideal body weight of 73.6 kg '2mg'$ /kg idea body weight (56m/kg) for total dose: 60 ml (most patients will be 630m    Monitoring: Pulse Ox with HR and cardiac monitor.  No pain/paresthesias during injection.  No intraneural/intrafasicular injection visualized.  No blood aspirated.  No resistance during aspiration.  No complications or signs/symptoms of local anesthetic systemic toxicity.  Extremity marked with time/date/anesthetic type and amount.    Procedure performed by: SuRudi RummageMD                    Electronically signed by: SuRudi RummageMD, Resident      I, NoKennon PortelaMD, was present for and supervised  key portions of the procedure(s).    Electronically signed by:  NoKennon PortelaMD - Attending Physician    Parts of this note  were created using a professional dictation software. Efforts were made to proofread and correct errors. Please pardon any residual errors.

## 2020-07-07 ENCOUNTER — Inpatient Hospital Stay (HOSPITAL_COMMUNITY): Payer: Medicare Other

## 2020-07-07 DIAGNOSIS — M9701XA Periprosthetic fracture around internal prosthetic right hip joint, initial encounter: Secondary | ICD-10-CM

## 2020-07-07 DIAGNOSIS — M978XXA Periprosthetic fracture around other internal prosthetic joint, initial encounter: Secondary | ICD-10-CM

## 2020-07-07 DIAGNOSIS — Z96651 Presence of right artificial knee joint: Secondary | ICD-10-CM

## 2020-07-07 DIAGNOSIS — S7291XA Unspecified fracture of right femur, initial encounter for closed fracture: Secondary | ICD-10-CM

## 2020-07-07 LAB — UR DRUGS OF ABUSE SCREEN
Amphetamine Screen, Urine: NEGATIVE
Barbiturates Screen, Urine: NEGATIVE
Benzodiazepines Screen, Urine: NEGATIVE
Cocaine Metabolite Scrn, Urine: NEGATIVE
Opiates Screen, Urine: POSITIVE — AB

## 2020-07-07 LAB — POC SARS-COV-2 + FLU A/B
POC Influenza A: NOT DETECTED
POC Influenza B: NOT DETECTED
POC SARS-COV-2: NOT DETECTED

## 2020-07-07 LAB — URINALYSIS AND CULTURE IF IND
Bilirubin Urine: NEGATIVE
Glucose Urine: NEGATIVE mg/dL
Ketones: NEGATIVE mg/dL
Nitrite Urine: NEGATIVE
Occult Blood Urine: NEGATIVE mg/dL
Protein Urine: NEGATIVE mg/dL
RBC: <1 /HPF (ref 0–2)
Specific Gravity, Urine: 1.013 (ref 1.002–1.030)
Squamous EPI: <1 /HPF (ref <=10)
Urobilinogen: NEGATIVE mg/dL (ref ?–2.0)
WBC, Urine: 7 /HPF — ABNORMAL HIGH (ref 0–5)
pH URINE: 6 (ref 4.8–7.8)

## 2020-07-07 LAB — VITAMIN D, 25 HYDROXY: Vitamin D, 25 Hydroxy: 30.9 ng/mL (ref 8.0–60.0)

## 2020-07-07 LAB — PARATHYROID HORMONE INTACT: Parathyroid Hormone Intact: 37 pg/mL (ref 15–65)

## 2020-07-07 MED ORDER — SENNOSIDES 8.6 MG TABLET
2.0000 | ORAL_TABLET | Freq: Every day | ORAL | Status: DC
Start: 2020-07-07 — End: 2020-07-13
  Administered 2020-07-07 – 2020-07-12 (×6): 17.2 mg via ORAL
  Filled 2020-07-07 (×6): qty 2

## 2020-07-07 MED ORDER — POLYETHYLENE GLYCOL 3350 17 GRAM ORAL POWDER PACKET
17.0000 g | Freq: Every day | ORAL | Status: DC
Start: 2020-07-08 — End: 2020-07-13
  Administered 2020-07-09 – 2020-07-12 (×4): 17 g via ORAL
  Filled 2020-07-07 (×4): qty 1

## 2020-07-07 NOTE — Nurse Assessment (Signed)
ASSESSMENT NOTE    Note Started: 07/07/2020, 19:35     Initial assessment completed and recorded in EMR.  Report received from day shift nurse and orders reviewed. Pt AOX4, VSS and afebrile. C/o R hip pain well controlled. Mild welling on the right hip. Pt resting in pain. Pt NPO from midnight for OR tomroww ORIF, Fracture Periprosthetic. Pt resting in bed. The call light within reach, bed alarmed and locked in low position. The bed pan within reach. Plan of Care reviewed and updated, discussed with patient.  Mendel Ryder, RN

## 2020-07-07 NOTE — ED Nursing Note (Addendum)
Assumed care from [place] for 1 hr lunch break from 0110 am to 0210 am .

## 2020-07-07 NOTE — Progress Notes (Signed)
TRAUMA TERTIARY EXAM NOTE    Patient Name: Cristina Carter (MRN: [id number])  Note Date and Time: 07/07/2020 07:26  Date of Admission: 07/06/2020  7:04 PM  Date of Tertiary Exam: 07/07/20  Patient's PCP: Colbert Coyer    UPDATED PAST MEDICAL HISTORY:  Past Medical History:   Diagnosis Date    Chronic kidney disease (CKD), stage IV (severe) ([place])     Diabetes mellitus ([place])     Hypertension      UPDATED PAST SURGICAL HISTORY:  Past Surgical History:   Procedure Laterality Date    EGD  10/14/2018    ERCP  10/14/2018     UPDATED HOME MEDICATIONS:  (Not in a hospital admission)    UPDATED ALLERGIES:  No Known Allergies     PATIENT DEMOGRAPHICS:  Living Situation: Lives with family/friends  Tentative D/C Plan: Home with family/friends  Insurance: Medicare  PCP Status: Has PCP with established relationship; has seen PCP with the last year.     Education: Education ended prior to high school    IDENTIFIED COMORBID CONDITIONS:    Co-morbid Conditions Additional Information    Functional Status    '[]'$  Advanced directive limiting care    '[]'$  Functionally dependent health status    '[]'$  Dementia/Alzheimer's    '[]'$  Live in assisted-living facility Since (month/year):    '[]'$  Homeless Since (month/year):     Substance Abuse    '[]'$  Current smoker Pack years?    '[]'$  Former smoker Pack years?    '[]'$  Alcohol use disorder # of drinks per week?    '[]'$  Drug use disorder Which drugs?     Cardiovascular    '[]'$  History of MI Stents? CABG? Aspirin? Statin?   '[]'$  History of angina < 30 days    '[]'$  Congestive heart failure (CHF) Last Echo?    '[]'$  Stroke On aspirin? On statin?    '[]'$  Diabetes mellitus (DM) On oral meds? On insulin?   '[x]'$  HTN requiring medication How many meds? 2   '[]'$  Peripheral vascular disease (PVD)     Other Co-morbidities    '[]'$  Active chemotherapy    '[]'$  Disseminated cancer    '[]'$  Steroid Use    '[]'$  Major psychiatric illness Diagnoses? Psychiatrist in past year?   '[]'$  ADD/ADHD On meds?   '[]'$  Bleeding disorder Diagnosis:    '[]'$  COPD On  inhalers?   '[x]'$  Chronic kidney disease (CKD) On dialysis? no   '[]'$  Cirrhosis    '[]'$  Congenital anomaly    '[]'$  Obesity Body mass index is 31.69 kg/m.       PHYSICAL EXAM:   GENERAL: Pleasant. Comfortable. Cooperative. Lee interpreter was used during Sales executive.  NEURO: Alert and oriented x3. No focal deficits.  HEAD/FACE: No evidence of head or facial trauma.  EENT: PERRLA. EOMI. Patent nares and airway.  NECK: No midline tenderness. Normal ROM. No c-collar.  BACK/SPINE: No midline tenderness or step-offs.  CHEST: Equal chest rise. Non-tender. No obvious deformity.  CARDIAC: Normal heart rate. Regular rhythm.  PULM: Breathing comfortably on room air.  ABDOMEN: Soft, non-tender, and non-distended.  PELVIS: Tender on left hip (side of fracture)  GU: No foley. No sign of GU trauma.  RUE: Non-tender. Normal strength and ROM. +2 radial. Endorses typical arthritic pain in DIP joints  LUE Non-tender. Normal strength and ROM. +2 radial.  Endorses typical arthritic pain in DIP joints  RLE: Non-tender. Normal strength and ROM. WWP.  LLE: Non-tender. Normal strength and ROM. WWP.  SKIN: No lacerations or  abrasions.  WOUNDS: No wounds.          CURRENT IMAGING:  DX CHEST 1 VIEW  1. No evidence of acute traumatic injury.   2. Blunting of the left costophrenic angle may reflect a small left   pleural effusion, or a prominent epicardial fat pad.   3. Borderline cardiomegaly.     CT C-SPINE WITHOUT CONTRAST  Alignment: Grade 1 anterolisthesis of C2 over C3 of 1 to 2 mm.   Reduced heights of C5-6 and C6-7 and to lesser extent C4-5 intervertebral   disc spaces consistent with degenerative disc disease   1. There is no acute fracture or subluxation of the cervical spine.     CT L-SPINE (2D RECONS L-SPINE FROM ABD/PELVIS)  Moderate multilevel degenerative changes, most conspicuous at the level of   L4-5 and L5-S1 where partially calcified posterior disc bulges result in mild spinal canal narrowing. There is moderate left neural  foraminal   narrowing at L5-S1.   1. No acute fracture or post-traumatic malalignment of the lumbar   spine.     CT ABDOMEN + PELVIS WITHOUT CONTRAST  Liver: Multiple coarse calcific densities likely reflect sequela of remote   inflammation.   Gallbladder: Enlarged, measuring approximately 10 cm in long axis.   GI Tract: Small hiatal hernia. Periampullary duodenal diverticulum measures approximately 2 cm. Colonic diverticulosis without evidence of   diverticulitis.   1. Right periprosthetic minimally displaced proximal femoral shaft   fracture.   2. No additional acute intra-abdominal traumatic injury.   3. Hydropic gallbladder with cholelithiasis. No evidence of   cholecystitis.   4. Colonic diverticulosis without evidence of diverticulitis.    CT T-SPINE (2D RECONS T-SPINE FROM CHEST)  1. There is no acute fracture or subluxation of the thoracic spine.     SHOULDER 3+ VIEWS RIGHT  1. No acute osseous abnormality.     HUMERUS, RIGHT  1. No acute osseous abnormality.     ELBOW 3+ VIEWS, RIGHT  1. No acute osseous abnormality.   2. 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     CT CHEST WO CONTRAST  A few scattered nodules measuring up to 2 mm (series 5   image 94) likely represent small granulomas or intraparenchymal lymph nodes  1. No evidence of acute intrathoracic trauma within limitations of lack   of IV contrast.    DX PELVIS OUTSIDE IMAGES 2ND INTERPRETATION  1. Acute mildly displaced periprosthetic fracture of the right femur.  The distal extent is not well determined on this study.     DX LOWER EXT OUTSIDE IMAGES 2ND INTERPRETATION  1. No acute osseous abnormality.  Right total knee prosthesis.    DX LOWER EXT OUTSIDE IMAGES 2ND INTERPRETATION  1. Mildly displaced periprosthetic fracture. No other acute fractures  identified.    CT LOWER EXTREMITY WITHOUT CONTRAST  1. Unchanged alignment of acute, minimally displaced periprosthetic right femoral fracture. No associated periprosthetic  lucency.    INCIDENTAL FINDINGS WITH PLANS:  Further imaging while inpatient:   None  Further consults while inpatient:   None  Recommended outpatient referral(s):   None  Recommended outpatient imaging:   None  Must discuss with PCP:   None  Likely of no consequence, but discuss with PCP:   Small hiatal hernia    I attest I have reviewed all final attending radiologist reads of imaging, and incidental findings. I attest I have documented all new injuries in this note with appropriate plans and recommendations. Incidental  findings were discussed with the patient and/or family members upon completion of this exam.    ASSESSMENT/PLAN:  No new injuries were identified on exam and/or review of imaging. The following new injuries were identified on exam and/or review of imaging: (None). New findings will be added to plan of care and continue to be addressed daily in progress notes. Past medical history, surgical history, and medications were updated in upon completion of this exam.    UPDATED INJURY LIST:  Head/Face:  - NOne  Neck/C-Spine:  - None   T & L-Spine:  - None  Chest:  - None  Abdomen/Pelvis:  - L periprosthetic hip fx  Extremities:  - None  Complications/Other:  - None    FURTHER IMAGING NEEDED BASED ON TERTIARY EXAM:  None      Myra Rude, DO  PGY-1, General Surgery  PI # [id number]  [facility] [phone]

## 2020-07-07 NOTE — Allied Health Progress (Signed)
PM&R--ACUTE CARE SERVICE  OCCUPATIONAL THERAPY NOTE [UNIT:ED]  Personal Protective Equipment Utilized: Surgical Mask       07/07/20 1612   Time and Intention   Document Type other (see comments)   Session Not Performed other (see comments)   Comment, Session Not Performed Referral received, chart reviewed, pt scheduled for surgery, will defer OT evaluation at this time, will need new OT orders and updated weight bearing/activity orders post-op     Was a new therapy order generated with a "Pending Discharge" priority designation?  No. A new PT/OT order was issued and it is NOT appropriate because the patient is pending a medical procedure within the next 24 hours that will require a new PT/OT order.    Electronically Signed by:  Kallie Edward, OTR/L  Occupational Therapist  Department of Physical Medicine and Rehabilitation  PI# [id number]  Vocera

## 2020-07-07 NOTE — Progress Notes (Addendum)
ORTHOPEDIC CO-MANAGEMENT DAILY PROGRESS NOTE    Date: 07/07/2020 Time: 20:40    HANDOFF SUMMARY  80yrold female patient with history of HTN presenting with R hip fracture.  Edited by: SLorn Junes MD at 07/07/2020 [phone]   ------------------------------------------------------------------------------------------------------------------  ASSESSMENT AND PLAN  81yrld female patient with history of HTN presenting with R hip fracture.  Edited by: SrLorn JunesMD at 07/07/2020 [phone]  Summary of Recommendations:  1. Will resume ARB post op    #R Periprosthetic hip fracture  -Defer to ortho for management    #Elevated lipase  No clinical correlation-- no abd pain, no imaging abnormalities.   - No further work up; monitor    #Osteoporosis  #Fragility fracture  #Pathologic Fracture  -Patients 6547ears of age or older with hip or spine fragility fracture should be treated for osteoporosis per national guidelines (ASBMR).   -Upon discharge, Hospitalist will :             -Prescribe supplements/ pharmacotherapies when appropriate: calcium, vitamin D, bisphosphonates              -Physical therapy outpatient (SNF vs. HH vs. Outpatient)             -Endocrinology Referral at discharge if indicated             -[facility] at [facility] if: the patient is geriatric with falls AND [facility] [place] with dot phrase .FRAGILITYFXDC in patient discharge instructions  -PCP should order baseline DEXA scan outpatient if not completed in the past year  -PCP should order bone health labs (Ca, Vit D) 3 months after discharge  - Based on Vit D level, will order supplementation; level normal               #HTN  - Norvasc continued  - Losartan HELD until surgery completed.     #Anemia, normocytic  -Monitor post op      FEN: REGULAR DIET  SPECIAL TRAY REQUEST  NPO, AFTER MIDNIGHT  Last BM: Last Bowel Movement:  07/05/20 (pt report)  DVT Prophylaxis: VTE prophylaxis contraindicated 2/2 surgery  Disposition: pending PT/OT eval    ------------------------------------------------------------------------------------------------------------------    SUBJECTIVE  Pt was seen at the bedside. Son was present and able to interpret. Pt was feeling well and did not have any complaints.     REVIEW OF SYSTEMS  Constitutional: negative.  Eyes: negative.  Ears, Nose, Mouth, Throat: negative.  CV: negative.  Resp: negative.  GI: negative.  GU: negative.  Musculoskeletal: negative.  Integumentary: negative.  Neuro: negative.  Psych: Mood pt's report, euthymic.     Medications  Acetaminophen (OFIRMEV) IV 1,000 mg, IV, Q6H Now   Or  Acetaminophen (TYLENOL) Tablet 1,000 mg, ORAL, Q6H Now  Amlodipine (NORVASC) Tablet 5 mg, ORAL, QAM  Atorvastatin (LIPITOR) Tablet 10 mg, ORAL, Daily Bedtime  CeFAZolin (KEFZOL, ANCEF) 2 g in Dextrose (iso-osm) 100 mL IVPB, IV, ON-CALL OR  Enoxaparin (LOVENOX) Injection 30 mg, SUBCUTANEOUS, Q12H  [Held by provider] Losartan (COZAAR) Tablet 100 mg, ORAL, QAM  [START ON 07/08/2020] Polyethylene Glycol 3350 (MIRALAX) Oral Powder Packet 17 g, ORAL, QAM  Sennosides (SENOKOT) Tablet 17.2 mg, ORAL, Daily Bedtime  Vancomycin (VANCOCIN) 1,000 mg in NaCl 0.9% 280 mL Vial Adaptor IVPB, IV, ON-CALL OR  Oxycodone (ROXICODONE) Tablet 5 mg, ORAL, Q4H PRN        OBJECTIVE    Vitals Signs  Current Vitals  Temp: 37.2 C (98.9 F)  BP: 128/79  Pulse: 88  Resp: 17  SpO2: 93 %     Weight: 73.6 kg (162 lb 4.1 oz)    Intake and Output  Last Two Completed Shifts  In: 220 [Oral:120; Crystalloid:100]  Out: -     Current Shift  No intake/output data recorded.    PHYSICAL EXAM   General: Alert, oriented, appropriate; appears stated age.  Comfortable in no acute distress.  HEENT:Hearing grossly intact. Throat clear.  CVS: RRR without murmurs, rubs, or gallops  Lungs: Clear to auscultation bilaterally.  No wheezes, or rhonchi.  Abd:  Soft, nontender.  Normoactive bowel sounds, without organomegaly.  Ext: No cyanosis, clubbing, or edema   Skin: Warm and well-perfused. No rash  Neuro: Alert and oriented to person, place, situation    Lines and Drains     Peripheral IV 07/06/20 20 g over-the-needle catheter Left Antecubital (Active)       Peripheral IV 07/06/20 18 g Right Antecubital (Active)        Labs and Studies  I have reviewed the following information from the last 24 hours: allied health and treating physician notes, imaging and labs and microbiology data    Please see top of the note for assessment and plan    >50% of a 15 minute  encounter was spent on counseling the patient and/or family, and coordination of care for the problems discussed in my note.       Report Electronically Signed by: Terance Ice, [facility]    Date: 07/07/2020 Time: 20:40

## 2020-07-07 NOTE — Nurse Assessment (Addendum)
TRANSFER NOTE - RECEIVING    Note Started: 07/07/2020, 18:25     Report received from EMR. Patient received at 1715 hours from ED unit by gurney. Pt condition stable . patient oriented to room and unit. Plan of care reviewed and updated. Inez Catalina Spanish interpreter used for assessment. Pt AOx4, VSS on room air. Reports pain to R hip. 2 RN skin assessment completed with Eulis Foster, RN. Small blanchable redness to sacrum, mepilex in place. No pressure injury present. Barton Dubois, RN

## 2020-07-08 ENCOUNTER — Inpatient Hospital Stay: Payer: Medicare Other | Admitting: Registered Nurse

## 2020-07-08 ENCOUNTER — Inpatient Hospital Stay: Payer: Medicare Other

## 2020-07-08 ENCOUNTER — Inpatient Hospital Stay (HOSPITAL_COMMUNITY): Payer: Medicare Other | Admitting: Registered Nurse

## 2020-07-08 ENCOUNTER — Encounter
Admission: AC | Disposition: A | Discharge status: Home Health | Payer: Self-pay | Source: Emergency Department | Attending: ORTHOPAEDIC SURGERY

## 2020-07-08 DIAGNOSIS — M80051A Age-related osteoporosis with current pathological fracture, right femur, initial encounter for fracture: Secondary | ICD-10-CM

## 2020-07-08 DIAGNOSIS — I10 Essential (primary) hypertension: Secondary | ICD-10-CM

## 2020-07-08 DIAGNOSIS — D649 Anemia, unspecified: Secondary | ICD-10-CM

## 2020-07-08 DIAGNOSIS — M9701XA Periprosthetic fracture around internal prosthetic right hip joint, initial encounter: Secondary | ICD-10-CM

## 2020-07-08 DIAGNOSIS — R748 Abnormal levels of other serum enzymes: Secondary | ICD-10-CM

## 2020-07-08 LAB — BASIC METABOLIC PANEL
Calcium: 8.9 mg/dL (ref 8.6–10.0)
Carbon Dioxide Total: 22 mmol/L (ref 22–29)
Chloride: 104 mmol/L (ref 98–107)
Creatinine Serum: 0.97 mg/dL (ref 0.51–1.17)
E-GFR Creatinine (Female): 55 mL/min/{1.73_m2}
Glucose: 123 mg/dL — ABNORMAL HIGH (ref 74–109)
Potassium: 4.2 mmol/L (ref 3.4–5.1)
Sodium: 137 mmol/L (ref 136–145)
Urea Nitrogen, Blood (BUN): 20 mg/dL (ref 6–20)

## 2020-07-08 LAB — CBC WITH DIFFERENTIAL
Basophils % Auto: 0.6 %
Basophils Abs Auto: 0 10*3/uL (ref 0.0–0.2)
Eosinophils % Auto: 2.2 %
Eosinophils Abs Auto: 0.1 10*3/uL (ref 0.0–0.5)
Hematocrit: 33 % — ABNORMAL LOW (ref 36.0–46.0)
Hemoglobin: 11.3 g/dL — ABNORMAL LOW (ref 12.0–16.0)
Lymphocytes % Auto: 16.5 %
Lymphocytes Abs Auto: 1 10*3/uL (ref 1.0–4.8)
MCH: 32 pg (ref 27.0–33.0)
MCHC: 34.3 % (ref 32.0–36.0)
MCV: 93.2 fL (ref 80.0–100.0)
MPV: 8.5 fL (ref 6.8–10.0)
Monocytes % Auto: 11.8 %
Monocytes Abs Auto: 0.7 10*3/uL (ref 0.1–0.8)
Neutrophils % Auto: 68.9 %
Neutrophils Abs Auto: 4.2 10*3/uL (ref 1.8–7.7)
Platelet Count: 167 10*3/uL (ref 130–400)
RDW: 13.2 % (ref 0.0–14.7)
Red Blood Cell Count: 3.54 10*6/uL — ABNORMAL LOW (ref 4.00–5.20)
White Blood Cell Count: 6.1 10*3/uL (ref 4.5–11.0)

## 2020-07-08 LAB — CULTURE URINE, BACTI

## 2020-07-08 LAB — C DIFFICILE SURVEILLANCE TEST: Test Result: NEGATIVE

## 2020-07-08 LAB — CULTURE SURVEILLANCE, MRSA

## 2020-07-08 SURGERY — ORIF, FRACTURE, HIP, PERIPROSTHETIC
Anesthesia: General | Site: Hip | Laterality: Right | Wound class: Clean Contaminated

## 2020-07-08 MED ORDER — CEFAZOLIN 1 GRAM SOLUTION FOR INJECTION
1.0000 g | Freq: Three times a day (TID) | INTRAMUSCULAR | Status: AC
Start: 2020-07-08 — End: 2020-07-09
  Administered 2020-07-08 – 2020-07-09 (×3): 1 g via INTRAVENOUS
  Filled 2020-07-08 (×3): qty 5

## 2020-07-08 MED ORDER — ACETAMINOPHEN 500 MG TABLET
1000.0000 mg | ORAL_TABLET | Freq: Three times a day (TID) | ORAL | Status: DC
Start: 2020-07-08 — End: 2020-07-11
  Administered 2020-07-08 – 2020-07-11 (×9): 1000 mg via ORAL
  Filled 2020-07-08 (×9): qty 2

## 2020-07-08 MED ORDER — PHENYLEPHRINE 1 MG/10 ML (100 MCG/ML) IN 0.9 % SOD.CHLORIDE IV SYRINGE
INJECTION | INTRAVENOUS | Status: DC | PRN
Start: 2020-07-08 — End: 2020-07-08
  Administered 2020-07-08 (×3): 100 ug via INTRAVENOUS

## 2020-07-08 MED ORDER — INTRAOP ROCURONIUM 10 MG/ML INJ 5 ML VIAL
Status: DC | PRN
Start: 2020-07-08 — End: 2020-07-08
  Administered 2020-07-08: 80 mg via INTRAVENOUS

## 2020-07-08 MED ORDER — SUGAMMADEX 100 MG/ML INTRAVENOUS SOLUTION
INTRAVENOUS | Status: DC | PRN
Start: 2020-07-08 — End: 2020-07-08
  Administered 2020-07-08: 300 mg via INTRAVENOUS

## 2020-07-08 MED ORDER — INTRAOP PROPOFOL INJ 20 ML VIAL (BOLUS+INFUSION)
Status: DC | PRN
Start: 2020-07-08 — End: 2020-07-08
  Administered 2020-07-08: 130 mg via INTRAVENOUS

## 2020-07-08 MED ORDER — NACL 0.9% IV INFUSION
INTRAVENOUS | Status: DC
Start: 2020-07-08 — End: 2020-07-11

## 2020-07-08 MED ORDER — HYDROMORPHONE 1 MG/ML INJECTION SYRINGE
0.2000 mg | INJECTION | INTRAMUSCULAR | Status: DC | PRN
Start: 2020-07-08 — End: 2020-07-08
  Administered 2020-07-08 (×2): 0.4 mg via INTRAVENOUS
  Administered 2020-07-08: 0.2 mg via INTRAVENOUS
  Filled 2020-07-08: qty 1

## 2020-07-08 MED ORDER — FENTANYL (PF) 50 MCG/ML INJECTION SOLUTION
INTRAMUSCULAR | Status: AC
Start: 2020-07-08 — End: 2020-07-08
  Filled 2020-07-08: qty 2

## 2020-07-08 MED ORDER — MEPERIDINE (PF) 50 MG/ML INJECTION SYRINGE
12.5000 mg | INJECTION | INTRAMUSCULAR | Status: DC | PRN
Start: 2020-07-08 — End: 2020-07-08

## 2020-07-08 MED ORDER — LACTATED RINGERS IV INFUSION
INTRAVENOUS | Status: DC
Start: 2020-07-08 — End: 2020-07-08

## 2020-07-08 MED ORDER — ONDANSETRON HCL (PF) 4 MG/2 ML INJECTION SOLUTION
4.0000 mg | INTRAMUSCULAR | Status: DC | PRN
Start: 2020-07-08 — End: 2020-07-08

## 2020-07-08 MED ORDER — INTRAOP NACL 0.9% 1000 ML IRRIGATION BOTTLE
Status: DC | PRN
Start: 2020-07-08 — End: 2020-07-08
  Administered 2020-07-08: 1000 mL

## 2020-07-08 MED ORDER — VANCOMYCIN 1 GRAM/200 ML IN DEXTROSE 5 % INTRAVENOUS PIGGYBACK
1000.0000 mg | INJECTION | INTRAVENOUS | Status: AC
Start: 2020-07-08 — End: 2020-07-09
  Administered 2020-07-09: 1000 mg via INTRAVENOUS
  Filled 2020-07-08: qty 200

## 2020-07-08 MED ORDER — LIDOCAINE HCL 20 MG/ML (2 %) INJECTION SOLUTION
INTRAMUSCULAR | Status: DC | PRN
Start: 2020-07-08 — End: 2020-07-08
  Administered 2020-07-08: 80 mg via INTRAVENOUS

## 2020-07-08 MED ORDER — PROPOFOL 10 MG/ML INTRAVENOUS EMULSION
INTRAVENOUS | Status: AC
Start: 2020-07-08 — End: 2020-07-08
  Filled 2020-07-08: qty 20

## 2020-07-08 MED ORDER — LIDOCAINE HCL 10 MG/ML (1 %) INJECTION SOLUTION
0.1000 mL | INTRAMUSCULAR | Status: DC | PRN
Start: 2020-07-08 — End: 2020-07-08

## 2020-07-08 MED ORDER — KETOROLAC 15 MG/ML INJECTION SOLUTION
15.0000 mg | Freq: Once | INTRAMUSCULAR | Status: AC
Start: 2020-07-08 — End: 2020-07-08
  Administered 2020-07-08: 15 mg via INTRAVENOUS
  Filled 2020-07-08: qty 1

## 2020-07-08 MED ORDER — INTRAOP FENTANYL 50 MCG/ML INJ 5 ML AMP
Status: DC | PRN
Start: 2020-07-08 — End: 2020-07-08
  Administered 2020-07-08: 25 ug via INTRAVENOUS
  Administered 2020-07-08 (×2): 50 ug via INTRAVENOUS

## 2020-07-08 MED ORDER — CHLORHEXIDINE GLUCONATE 0.12 % MOUTHWASH
15.0000 mL | MOUTHWASH | Status: DC
Start: 2020-07-08 — End: 2020-07-08

## 2020-07-08 MED ORDER — ONDANSETRON HCL (PF) 4 MG/2 ML INJECTION SOLUTION
INTRAMUSCULAR | Status: DC | PRN
Start: 2020-07-08 — End: 2020-07-08
  Administered 2020-07-08: 4 mg via INTRAVENOUS

## 2020-07-08 MED ORDER — FENTANYL (PF) 50 MCG/ML INJECTION SOLUTION
25.0000 ug | INTRAMUSCULAR | Status: DC | PRN
Start: 2020-07-08 — End: 2020-07-08
  Administered 2020-07-08 (×4): 25 ug via INTRAVENOUS
  Filled 2020-07-08: qty 2

## 2020-07-08 MED ORDER — METOCLOPRAMIDE 5 MG/ML INJECTION SOLUTION
10.0000 mg | INTRAMUSCULAR | Status: DC | PRN
Start: 2020-07-08 — End: 2020-07-08

## 2020-07-08 SURGICAL SUPPLY — 41 items
APPLICATOR CEMENT MIXING WITH FEMORAL BREAKAWAY NOZZLE / CEMENT GUN MIXING KIT (Device) IMPLANT
BLADE SAW SAGITTAL LONG NARROW 19 X 100MM TOTAL JOINT POWER SYSTEM (Blade) ×2 IMPLANT
CAUTERY BOVIE PAD ADULT (Cautery) ×2 IMPLANT
COVER LIGHT HANDLE STERIS (Drape) ×4 IMPLANT
DRAIN WOUND SUCTION KIT (Drain) IMPLANT
DRAPE BACK TABLE COVER 44 X 88IN (Drape) IMPLANT
DRAPE C ARM SHOWER CAP (Drape) ×2 IMPLANT
DRAPE STERI 17 X 11 3M 1000 (Drape) IMPLANT
DRAPE TOWEL WHITE TERRY STERILE (Drape) ×2 IMPLANT
DRESSING ABD 7.5 X 8IN (Dressing) ×6 IMPLANT
DRESSING COBAN 4IN X 5YD LATEX FREE STERILE (Dressing) ×2 IMPLANT
DRESSING FLUFFS 6 X 6 3/4IN (Dressing) ×4 IMPLANT
DRESSING XEROFORM GAUZE 1 X 8IN PLAIN PETROLATUM GAUZE (Dressing) ×4 IMPLANT
FEMORAL CANAL BRUSH FOR PULSE LAVAGE SUCTION IRRIGATION (Irrigation) IMPLANT
GLOVES BIOGEL 7 TOP GLOVE LATEX (Glove) ×2 IMPLANT
GLOVES BIOGEL INDICATOR 7 GREEN UNDERGLOVE LATEX (Glove) ×2 IMPLANT
HEADREST MULTI RING 9IN X 3-7IN (Pad) ×2 IMPLANT
LCP SCREW 5.0 LOCKING STARDRIVE 22MM SS LG COMBO 212.205 (Screw) ×2 IMPLANT
LCP SCREW 5.0 LOCKING STARDRIVE 36MM SS LG COMBO 212.212 (Screw) ×2 IMPLANT
LG FRAG SCREW 4.5MM CORT ST 038MM 214.838 (Screw) ×4 IMPLANT
LG FRAG SCREW 4.5MM CORT ST 040MM 214.840 (Screw) ×2 IMPLANT
LG FRAG SCREW 4.5MM CORT ST 064MM 214.864 (Screw) IMPLANT
LG FRAG SCREW 4.5MM CORT ST 066MM 214.866 (Screw) ×2 IMPLANT
PACK TOTAL HIP WITH BASINS AND PREP TRAY (Pack) ×2 IMPLANT
PLATE LCP CRVD BROAD 4.5MM 10HL 188MM (Plate) ×2 IMPLANT
PREP ALCOHOL PACK (Pack) ×2 IMPLANT
PREP CHLORAPREP 26ML WITH TINT ~~LOC~~ (Prep) ×6 IMPLANT
PREP DURAPREP 26ML (Prep) ×2 IMPLANT
SCREW LG FRAG CANC FT 6.5MM 60MM 218.060 (Screw) ×2 IMPLANT
SCREW PERIPROSTHETIC LOCKING ST STARDRIVE 5.0 X 18MM 02.221.518S (Screw) ×2 IMPLANT
SOLUTION WATER POUR BOTTLE 1L / EQUIPMENT USE ONLY (Solution) IMPLANT
STAPLER SKIN 35W (Stapler) ×4 IMPLANT
SUCTION SIMPULSE SOLO SYSTEM WITH PULSE LAVAGE (Irrigation) IMPLANT
SUTURE ETHILON 3-0 FSL 30IN BLACK (Suture) ×2 IMPLANT
SUTURE POLYSORB 2-0 V-20 18IN POP OFF 5 PACK UNDYED (Suture) ×2 IMPLANT
SUTURE TICRON 2 HGS-21 30IN (Suture) ×4 IMPLANT
SUTURE VICRYL 0 CT-1 18IN CONTROL RELEASE 8 PACK UNDYED (Suture) ×2 IMPLANT
SUTURE VICRYL 1 CTX 36IN VIOLET (Suture) ×4 IMPLANT
SUTURE VICRYL 2-0 SH 27IN UNDYED (Suture) ×4 IMPLANT
SYNTHES CABLE SS 1.7MM 298.801.01S (Cable) ×2 IMPLANT
SYNTHES PIN CERCLAGE THD 4.5MM 298.803S (Pin) ×2 IMPLANT

## 2020-07-08 NOTE — Nurse Assessment (Signed)
ASSESSMENT NOTE    Note Started: 07/08/2020, 08:24     Initial assessment completed and recorded in EMR.  Report received from night shift nurse and orders reviewed. Plan of Care reviewed and appropriate, discussed with patient.  Barton Dubois, RN

## 2020-07-08 NOTE — Nurse Assessment (Signed)
TRANSFER NOTE - RECEIVING    Note Started: 07/08/2020, 16:01     Report received from EMR. Patient received at 1535 hours from PACU unit by bed. Pt condition stable. patient oriented to room and unit. Plan of care reviewed and updated. Pt AOx4, VSS on 2L NC. Currently reporting 9/10 pain to R hip. Denies any SOB/chest pain. Cristina Dubois, RN

## 2020-07-08 NOTE — Anesthesia Postprocedure Evaluation (Signed)
Patient: Cristina Carter    ORIF, FRACTURE, HIP, PERIPROSTHETIC    Anesthesia Type: general    Stop Bang: 2    Vital Signs (Last Recorded):  BP: 133/70 (07/08/20 1500)  Pulse: 86 (07/08/20 1500)  Resp: 16 (07/08/20 1500)  Temp Max: 37.9 C (100.2 F)  (Last 24 hours)  Temp: 36.7 C (98.1 F) (07/08/20 1500)  SpO2: 96 % (07/08/20 1500) on    Device (Oxygen Therapy): nasal cannula (07/08/20 1500)      Anesthesia Post Evaluation    Procedure: Procedure(s):ORIF, FRACTURE, HIP, PERIPROSTHETIC  [facility]  Anesthesia: General    Patient location during evaluation: PACU  Patient participation: complete - patient participated  Level of consciousness: sleepy but conscious  Pain score: 5  Pain management: satisfactory to patient  Multimodal analgesia pain management approach    Airway patency: patent  Two or more strategies used to mitigate risk of obstructive sleep apnea  Anesthetic complications: no  Cardiovascular status: blood pressure returned to baseline  Respiratory status: nasal cannula  Hydration status: euvolemic  Nausea and Vomiting: absent    Comments: Pain is controlled adequately. Required additional IV Toradol. No nausea or vomiting. No apparent anesthetic complications. Stable for transfer out of PACU.                  Samul Dada, MD

## 2020-07-08 NOTE — Nurse Assessment (Signed)
PACU ADMIT NURSING NOTE    Note Started: 07/08/2020, 13:46     Received patient from OR at 1334 hours via bed.  Monitor and Alarms on.  Patient sleepy but arousable. Reuel Derby, RN

## 2020-07-08 NOTE — Care Plan (Signed)
Problem: Adult Inpatient Plan of Care  Goal: Plan of Care Review  Outcome: Ongoing, Progressing  Flowsheets (Taken 07/08/2020 0509)  Outcome Evaluation: Pt slept through the shift. VSS and afebrile. Pain well controlled. PRN oxycodone givne per order. Pt NPO for OR today - ORIF, FRACTURE PERIPROSTHETIC. Pt repositioned regularly as tolerated. Voiding in the bed pan. The call light within reach, bed alarmed and locked in low position. Regular rounding and patient safety observed. No acute events overnight.

## 2020-07-08 NOTE — Nurse Transfer Note (Signed)
PACU Transfer Note, [facility] Transport  Note started on 07/08/2020 , 15:25    Patient transferred to d14 unit  via bed. Escorted by [facility] at 1525 hours. Assessment unchanged.  Belongings with not applicable. Report in EMR and Charge Nurse notified patient in route. Reuel Derby, RN

## 2020-07-08 NOTE — Op Note (Addendum)
OPERATIVE REPORT    Preoperative diagnosis: Right periprosthetic femur fracture    Postoperative diagnosis: Right periprosthetic femur fracture    Procedure: ORIF Right femur    CPT: 27507    Surgeon: Dr. Casey Burkitt    Assistants:  Duard Larsen, MD Resident  Malachi Bonds, MD Resident  Petra Kuba, APP fellow    Implants:    Implant Name Type Inv. Item Serial No. Manufacturer Lot No. LRB No. Used Action   SYNTHES CABLE SS 1.7MM [id number] - S/ - [id number] Cable SYNTHES CABLE SS 1.7MM [id number] / [employer] ([place]) [id number] Right 1 Implanted   SYNTHES PIN CERCLAGE THD 4.5MM 298.803S - S/ - [id number] Pin SYNTHES PIN CERCLAGE THD 4.5MM 298.803S / [employer] ([place]) [id number] Right 1 Implanted   PLATE LCP CRVD BROAD 4.5MM 10HL 188MM - S/ - [id number] Plate PLATE LCP CRVD BROAD 4.5MM 10HL 188MM / [employer] ([place]) / Right 1 Implanted   LG FRAG SCREW 4.5MM CORT ST 038MM 214.838 - S/ - [id number] Screw LG FRAG SCREW 4.5MM CORT ST 038MM 214.838 / [employer] ([place]) / Right 2 Implanted   LG FRAG SCREW 4.5MM CORT ST 066MM 214.866 - S/ - [id number] Screw LG FRAG SCREW 4.5MM CORT ST 066MM 214.866 / [employer] ([place]) / Right 1 Implanted   LG FRAG SCREW 4.5MM CORT ST 040MM 214.840 - S/ - [id number] Screw LG FRAG SCREW 4.5MM CORT ST 040MM 214.840 / [employer] ([place]) / Right 1 Implanted   LG FRAG SCREW 6.5MM CANC FT 60MM (218.060) - S/ - [id number] Screw LG FRAG SCREW 6.5MM CANC FT 60MM (218.060) / [employer] ([place]) / Right 1 Implanted   5.47m PERIPROSTHETIC LCKG SCREW   / [employer] ([place] [id number] 1 Implanted   LCP SCREW 5.0 LOCKING STARDRIVE [id number] LG COMBO 212.205 - S/ - [id number] LCP SCREW 5.0 LOCKING STARDRIVE [id number] LG COMBO 212.205 / [employer] ([place] / Right 1 Implanted   LCP SCREW 5.0 LOCKING STARDRIVE [id number] LG COMBO 212.212 - S/ - [id number] LCP SCREW 5.0 LOCKING STARDRIVE [id number] LG COMBO 212.212 / [employer] ([place] / Right 1 Implanted       Anesthesia: General    EBL: 30000000   Complications:  none    Disposition: Stable to PACU      Indication for procedure: Cristina Carter a 89yr with prior history of THA s/p fall onto right hip with pain and inability to bear weight. Imaging demonstrated periprosthetic femur fracture with stable stem. Patient was admitted and planned for operative intervention. I explained the diagnosis and treatment to the patient. We reviewed the planned procedure including risks/benefits. Risks include but are not limited to bleeding, infection, injury to blood vessels or nerves, painful hardware, hardware failure, non union, malunion, need for reoperation, DVT, PE, other unforeseen complications including death. Patient expressed understanding of above and had questions answered. Consent scanned into chart.      Description of procedure: A pre operative huddle with the surgeon/representative, anesthesia team, and OR nursing team was performed.  The patient was brought to the operating room and following induction of anesthesia was positioned, prepped, and draped in the usual standard surgical fashion. A time out was performed to identify the correct patient, side, and site of surgery, administration of IV antibiotics, and availability of all equipment and implants.  We marked out our surgical incision proximally over her previous scar from her total hip replacement on the posterolateral aspect of  her femur.  A 10. Blade was used to incise the skin and sharp dissection was carried at the skin and subcutaneous fat down the ITB band.  The ITB band was incised and then the vastus lateralis was elevated from posterior to anterior to expose the lateral aspect of the proximal femur.  We then selected the Synthes 18 hole 4.5 mm curved broad plate confirming fluoroscopically that this was the satisfactory length to span both were total hip and total knee prostheses.  The plate was contoured proximally and distally to accommodate the proximal flare for the greater trochanter and the  distal flare of the femoral condyle.  The plate was provisionally pinned in position after passing it proximally and distally.  A separate incision was required distally on the lateral aspect of the femur to accommodate the distal portion of the plate.  A 10. Blade was used to incise the skin and sharp dissection was carried out through the skin and subcu fat down to the ITB band.  The ITB band was incised the muscle of the vastus lateralis was elevated.  We then were able to pass the plate and then position it appropriately on the femur confirming this on the AP and lateral projections.  The plate was then K-wired into position using the proximal distal K-wire holes.  We then secured the plate just distal to the tip of the total hip stem with a 4.5 mm cortical screw.  We placed additional 4.5 mm cortical screws distally around the total knee prosthesis.  We then placed additional 4.5 mm unicortical and bicortical locking screws proximally as well as the 6.5 mm cancellous screw in the most proximal hole.  The cable Passer was used with a positioning pin to place 1 x 1.7 mm in the axial cable around the proximal femur in the region of the total hip replacement.  The cable was tensioned around the femur using standard technique and then crimped and cut.  We then removed our provisional K-wires and final imaging AP and lateral of the hip, proximal femur, and distally at the knee demonstrated satisfactory reduction and appropriate placement of all implants.  Both wounds were then copiously irrigated.  The ITB band was repaired with 1. Vicryl suture in interrupted fashion.  The vastus lateralis proximally was repaired with 1. Vicryl suture followed by the ITB band with 1. Vicryl suture.  The subcutaneous tissue was repaired with 2-0 Vicryl suture.  The skin was closed with surgical staples.  Xeroform and dry sterile dressings were then applied.  The patient tolerated well without complication and was then extubated and  transferred to the PACU in stable condition.    Post op plan:  -WBAT RLE  -Lovenox x 28 days for DVT ppx  -24 hrs periop antibiotics  -Follow up 2 weeks for staple removal    I was present and scrubbed for the entire procedure.    Quentin Angst, MD        Do not Publishing copy services to [employer].      Report electronically signed by:  Quentin Angst, MD

## 2020-07-08 NOTE — Nurse Assessment (Signed)
ASSESSMENT NOTE    Note Started: 07/08/2020, 20:33     Initial assessment completed and recorded in EMR.  Report received from day shift nurse and orders reviewed. Plan of Care reviewed and appropriate, discussed with patient.  Lowry Ram, RN

## 2020-07-08 NOTE — Anesthesia Preprocedure Evaluation (Addendum)
Anesthesia Evaluation      Medical History, Review of Systems, and Physical Exam  HPI:80yrold female patient with history of HTN presenting with R periprosthetic hip fracture s/p GLF for ORIF.    COVID neg 7/1/22Negative anesthesia history    Airway   Mallampati: I  TM distance: >3 FB     Neck ROM is full.     Dental    (+) missing       Pulmonary - negative for pulmonary conditions with ROS and normal exam Cardiovascular - cardiovascular exam normal  (+) hypertension (well controlled),    (-) DOE    Patient has poor exercise tolerance. (Uses walker with seat, stops to rest as needed)   Neuro/Psych - negative neuro/psych ROS  GI/Hepatic/Renal   (+) GERD (), PUD, chronic renal disease (Cr 0.97) (CKD),         Abdominal   (+) obese   Endo/Other    (-) diabetes  Smoking History  No history of smoking                            Anesthesia Plan    ASA 3     general     Intravenous induction    Postoperative administration of opioids is intended.  Trial extubation is planned.    Anesthetic plan and risks discussed with patient.  Resident/Fellow/CRNA discussed the plan with the attending.    I performed the pre-anesthetic exam and prescribed the anesthesia plan.  I reviewed the available note and agree with the documentation.

## 2020-07-08 NOTE — Nurse Focus (Signed)
2 RN skin assessment completed with Fredonia Highland, RN. Gauze dsg to RLE c/d/i. Blanchable redness to sacrum, mepilex applied. No pressure injury present.

## 2020-07-08 NOTE — Nurse Focus (Signed)
Pt transported to pre-op via bed by two transporters at 1000. Pt awake, alert and oriented. Pt condition stable.

## 2020-07-08 NOTE — Anesthesia Procedure Notes (Signed)
ANESTHESIA AIRWAY  Date/Time: 07/08/2020 11:17 AM    Staff    Patient location:  [facility]          Indications and Patient Condition    Spontaneous Ventilation: absent  Sedation level: level 0: deep/analgesia  Preoxygenated: yes  Patient position: sniffing  MILS not maintained throughout  Mask difficulty assessment: 1 - vent by mask    Final Airway Details    Final airway type: endotracheal airway      Successful airway: cuffed ETT    Successful intubation technique: direct laryngoscopyEndotracheal tube insertion site: oral  Blade: Macintosh  Blade size: #3  ETT size: 7.5 mm  Cormack-Lehane Classification: grade I - full view of glottis  Placement verified by: chest auscultation and capnometry   Measured from: lips  Secured at 21 cm.  Number of attempts at approach: 1

## 2020-07-08 NOTE — Care Plan (Signed)
Respiratory Care Evaluation - Pulmonary Hygiene Pathway Initial Evaluation    Name: Cristina Carter Admit Date:   07/06/2020  7:04 PM Date of Birth:   [birth year]     Age: 80 yrs Sex: female        Chief Complaint:   Chief Complaint   Patient presents with    *922:Blunt/Critical Trauma Level II       Risk Factor Analysis:    *If any part of the risk factor analysis cannot be filled out, do not apply the tool: assess/tx as appropriate. Physician may order a new evaluation via the risk factor analysis at any time.    Score with Spirometry  0 - 4 points = low risk: no atelectasis; no retained secretions  5 - 9 points = moderate risk  10 - 17 points = high risk  18 - 24 points = acute risk    Score without Spirometry  0 - 3 points = low risk: no atelectasis; no retained secretions  4 - 7 points = moderate risk  8 - 14 points = high risk  15 - 20 points = acute risk    Spirometry:  No spirometry on file  Age:  >= 75 = 2  Obesity:  <150% IBW = 0  Surgery Location:  other = 1  Pulmonary Hx:  None = 0  Incentive Spirometry:  < 51m/kg IBW = 1  Atelectasis/consolidation on CXR:  no atelectasis/consolidation/cxray = 0  Smoking Hx:  still smoking = 2  Mobility Issues:   Limited mobility = 1    Risk Factor Total Score:7  Risk Level: Moderate Risk          Treatment Plan by RT:   Modality: PAP  Mouthpiece  Frequency: BID    Treatment Plan by RN:  4x/day deep breathing, I.S., cough/splint, ambulate/chair per pathway.     *Moderate through acute risk patients will be re-evaluated every 24 hours using the Progress Scoring Tool.    If patient's condition changes please reorder Respiratory Care Evaluation.    ARupert Stacks RT

## 2020-07-08 NOTE — Progress Notes (Signed)
ORTHOPEDIC CO-MANAGEMENT DAILY PROGRESS NOTE    Date: 07/08/2020 Time: 15:29    HANDOFF SUMMARY  80yrold female patient with history of HTN presenting with R hip fracture.  Edited by: SLorn Junes MD at 07/07/2020 [phone]   ------------------------------------------------------------------------------------------------------------------  ASSESSMENT AND PLAN  89yrld female patient with history of HTN presenting with R hip fracture.  Edited by: SrLorn JunesMD at 07/07/2020 [phone]  Summary of Recommendations:  1. Can resume ARB in AM (unheld for you)    #R Periprosthetic hip fracture  -Defer to ortho for management    #Elevated lipase  No clinical correlation-- no abd pain, no imaging abnormalities.   - No further work up; monitor    #Osteoporosis  #Fragility fracture  #Pathologic Fracture  PTH wnl, vit D, 25-OH wnl, phos WNL, [place] wnl.              -Physical therapy outpatient (SNF vs. HH vs. Outpatient)             -Include patient education with dot phrase .FRAGILITYFXDC in patient discharge instructions  -PCP should order baseline DEXA scan outpatient if not completed in the past year  -PCP should order bone health labs (Ca, Vit D) 3 months after discharge  - Based on Vit D level, will order supplementation; level normal               #HTN  - Norvasc continued  - Can resume losartan in AM    #Anemia, normocytic  -Monitor post op    FEN: SPECIAL TRAY REQUEST  REGULAR DIET  Last BM: Last Bowel Movement: 08/04/20  DVT Prophylaxis: VTE prophylaxis contraindicated 2/2 surgery  Disposition: pending PT/OT eval    ------------------------------------------------------------------------------------------------------------------    SUBJECTIVE  Patient was in OR today. Unable to see her.     REVIEW OF SYSTEMS  Constitutional: negative.  Eyes: negative.  Ears, Nose, Mouth, Throat: negative.  CV: negative.  Resp: negative.  GI: negative.  GU: negative.  Musculoskeletal: negative.  Integumentary:  negative.  Neuro: negative.  Psych: Mood pt's report, euthymic.     Medications  Acetaminophen (TYLENOL) Tablet 1,000 mg, ORAL, Q8H  Amlodipine (NORVASC) Tablet 5 mg, ORAL, QAM  Atorvastatin (LIPITOR) Tablet 10 mg, ORAL, Daily Bedtime  CeFAZolin (KEFZOL, ANCEF) IV 1 g, IV, Q8H Now  Enoxaparin (LOVENOX) Injection 30 mg, SUBCUTANEOUS, Q12H  [Held by provider] Losartan (COZAAR) Tablet 100 mg, ORAL, QAM  Polyethylene Glycol 3350 (MIRALAX) Oral Powder Packet 17 g, ORAL, QAM  Sennosides (SENOKOT) Tablet 17.2 mg, ORAL, Daily Bedtime  Vancomycin (VANCOCIN) 1,000 mg in Dextrose (iso-osm) 200 mL IVPB, IV, Q24H Now        Oxycodone (ROXICODONE) Tablet 5 mg, ORAL, Q4H PRN        OBJECTIVE    Vitals Signs  Current Vitals  Temp: 36.7 C (98.1 F)  BP: 136/69  Pulse: 86  Resp: 14  SpO2: 96 %  Flow (L/min): 2  Weight: 73.6 kg (162 lb 4.1 oz)    Intake and Output  Last Two Completed Shifts  In: 370 [Oral:370]  Out: -     Current Shift  In: 493.3 [Oral:50; Crystalloid:443.3]  Out: 300 [Other:300]    PHYSICAL EXAM  ** patient was in OR today** unable to examine    Lines and Drains     Incision/Wound/Pressure Injury 07/08/20 Incision Leg Right (Active)       Peripheral IV 07/06/20 20 g over-the-needle catheter Left Antecubital (Active)  Labs and Studies  I have reviewed the following information from the last 24 hours: allied health and treating physician notes, imaging and labs and microbiology data    Please see top of the note for assessment and plan    Report Electronically Signed by:   Caswell Corwin, MD   Associate Physician  [facility]  Pager: [phone]  PI: [id number]    Date: 07/08/2020 Time: 15:29

## 2020-07-08 NOTE — Care Plan (Signed)
Problem: Adult Inpatient Plan of Care  Goal: Plan of Care Review  Outcome: Ongoing, Progressing  Flowsheets (Taken 07/08/2020 1836)  Plan of Care Reviewed With: patient  Progress: no change  Outcome Evaluation: AOx4, VSS on room air. R femur ORIF completed today. Gauze dsg to RLE c/d/i. Prn oxycodone for pain management. Voids in bedpan, repositions with assistance. Tolerating current diet and PO fluids. Plan to continue pain management and work with PT.

## 2020-07-08 NOTE — Progress Notes (Addendum)
Orthopaedic Trauma Progress Note  Date: 07/08/2020  Unit: P3ORPR/P3PB  Attending of Record: Attending Provider: Braulio Conte, MD    ID:  Cristina Carter is a 80yrold female with right periprosthetic hip fracture after GLF.      Procedures:    OCTOR    Interval Events:  none    Subjective  Pain controlled     Objective:  Temp src: Oral (07/02 0900)  Temp:  [36.8 C (98.2 F)-37.9 C (100.2 F)]   Pulse:  [65-88]   BP: (110-143)/(66-79)   Resp:  [15-18]   SpO2:  [93 %-97 %]   I/O Last 2 Completed Shifts:  In: 344[Oral:370]  Out: -      PE:  Gen: NAD    Right lower extremity:  Inspection: skin intact  Drain: none  Vascular: palpable dorsalis pedis pulse, toes warm and well perfused, capillary refill < 2s  Motor function: intact DF/PF/EHL  Sensory: S/S/SP/DP/T intact to light touch    Laboratory Tests:  Lab Results   Lab Name Value Date/Time    WBC 6.1 07/08/2020 04:18 AM    HGB 11.3 (L) 07/08/2020 04:18 AM    HCT 33.0 (L) 07/08/2020 04:18 AM    PLT 167 07/08/2020 04:18 AM     Lab Results   Lab Name Value Date/Time    NA 137 07/08/2020 04:18 AM    K 4.2 07/08/2020 04:18 AM    CL 104 07/08/2020 04:18 AM    CO2 22 07/08/2020 04:18 AM    BUN 20 07/08/2020 04:18 AM    CR 0.97 07/08/2020 04:18 AM    GLU 123 (H) 07/08/2020 04:18 AM       Assesment and Plan:  MMalikia Bergsis a 89yrld female with Cristina Carter periprosthetic femur fracture.      # R periprosthetic femur fracture  - Dressing: none  - Drains: none  - Weight bearing status: NWB RLE  - Imaging: preop complete  - Cultures: none  - Antibiotics: OCTOR      General:  - Pain Management: PO with IV rescue  - PT/OT  - Physical Precautions: none  - Foley: none  - DVT Prophylaxis: LMWH    Dispo: Pending surgical stabilization        Electronically signed by:      GrLenon CurtMD PGY-2  Pager: [phone]: [phone] Surgery      Orthopaedic Trauma Contact List:     Primary/1st Call:   FOR PATIENTS ON D14 AND D11 PLEASE PAGE: NP [phone](7d/week 7am-6pm)  FOR PATIENTS ON ALL OTHER FLOORS PLEASE PAGE: NP [phone] (7d/week 7am-6pm)  2nd Call: Randhawa [phone]   3rd Call: McKeithan [phone]  4th Call: HaLaverta [place]2961  5th Call: BaDenny [phone]-cover: [phone]-cover is primary contact 7d/week 6pm-7am  (This service has an in house resident covering all orthopedic patients. Please limit pages between the hours of 6pm-6am to critical issues)    I saw and evaluated the patient and agree with the resident/fellow's note/plan as above.     [name]

## 2020-07-08 NOTE — Nurse Assessment (Signed)
PREOP ADMIT NURSING NOTE    Note Started: 07/08/2020, 10:08     Received patient from D14 at 1000 hours via bed.  Patient status  awake and alert.  Oriented to room and unit.  Admission assessment and care plan initiated.   PIN number cards provided to patient.   Prescott Parma, RN

## 2020-07-09 ENCOUNTER — Inpatient Hospital Stay (HOSPITAL_COMMUNITY): Payer: Medicare Other

## 2020-07-09 DIAGNOSIS — K219 Gastro-esophageal reflux disease without esophagitis: Secondary | ICD-10-CM

## 2020-07-09 DIAGNOSIS — M80051A Age-related osteoporosis with current pathological fracture, right femur, initial encounter for fracture: Secondary | ICD-10-CM

## 2020-07-09 DIAGNOSIS — M9701XA Periprosthetic fracture around internal prosthetic right hip joint, initial encounter: Secondary | ICD-10-CM

## 2020-07-09 LAB — CBC WITH DIFFERENTIAL
Basophils % Auto: 0.3 %
Basophils % Auto: 0.5 %
Basophils Abs Auto: 0 10*3/uL (ref 0.0–0.2)
Basophils Abs Auto: 0 10*3/uL (ref 0.0–0.2)
Eosinophils % Auto: 0.1 %
Eosinophils % Auto: 0.4 %
Eosinophils Abs Auto: 0 10*3/uL (ref 0.0–0.5)
Eosinophils Abs Auto: 0 10*3/uL (ref 0.0–0.5)
Hematocrit: 25.5 % — ABNORMAL LOW (ref 36.0–46.0)
Hematocrit: 22.3 % — ABNORMAL LOW (ref 36.0–46.0)
Hemoglobin: 9.1 g/dL — ABNORMAL LOW (ref 12.0–16.0)
Hemoglobin: 7.8 g/dL — ABNORMAL LOW (ref 12.0–16.0)
Lymphocytes % Auto: 11.4 %
Lymphocytes % Auto: 12.3 %
Lymphocytes Abs Auto: 0.9 10*3/uL — ABNORMAL LOW (ref 1.0–4.8)
Lymphocytes Abs Auto: 0.8 10*3/uL — ABNORMAL LOW (ref 1.0–4.8)
MCH: 33.2 pg — ABNORMAL HIGH (ref 27.0–33.0)
MCH: 32.9 pg (ref 27.0–33.0)
MCHC: 35.8 % (ref 32.0–36.0)
MCHC: 34.9 % (ref 32.0–36.0)
MCV: 92.8 fL (ref 80.0–100.0)
MCV: 94.2 fL (ref 80.0–100.0)
MPV: 8.6 fL (ref 6.8–10.0)
MPV: 9.1 fL (ref 6.8–10.0)
Monocytes % Auto: 10.4 %
Monocytes % Auto: 11.6 %
Monocytes Abs Auto: 0.8 10*3/uL (ref 0.1–0.8)
Monocytes Abs Auto: 0.8 10*3/uL (ref 0.1–0.8)
Neutrophils % Auto: 77.8 %
Neutrophils % Auto: 75.2 %
Neutrophils Abs Auto: 5.8 10*3/uL (ref 1.8–7.7)
Neutrophils Abs Auto: 4.9 10*3/uL (ref 1.8–7.7)
Platelet Count: 183 10*3/uL (ref 130–400)
Platelet Count: 173 10*3/uL (ref 130–400)
RDW: 13 % (ref 0.0–14.7)
RDW: 13 % (ref 0.0–14.7)
Red Blood Cell Count: 2.75 10*6/uL — ABNORMAL LOW (ref 4.00–5.20)
Red Blood Cell Count: 2.37 10*6/uL — ABNORMAL LOW (ref 4.00–5.20)
White Blood Cell Count: 7.5 10*3/uL (ref 4.5–11.0)
White Blood Cell Count: 6.5 10*3/uL (ref 4.5–11.0)

## 2020-07-09 LAB — BASIC METABOLIC PANEL
Calcium: 8.1 mg/dL — ABNORMAL LOW (ref 8.6–10.0)
Calcium: 7.9 mg/dL — ABNORMAL LOW (ref 8.6–10.0)
Carbon Dioxide Total: 19 mmol/L — ABNORMAL LOW (ref 22–29)
Carbon Dioxide Total: 20 mmol/L — ABNORMAL LOW (ref 22–29)
Chloride: 103 mmol/L (ref 98–107)
Chloride: 104 mmol/L (ref 98–107)
Creatinine Serum: 1.05 mg/dL (ref 0.51–1.17)
Creatinine Serum: 1 mg/dL (ref 0.51–1.17)
E-GFR Creatinine (Female): 50 mL/min/{1.73_m2}
E-GFR Creatinine (Female): 53 mL/min/{1.73_m2}
Glucose: 177 mg/dL — ABNORMAL HIGH (ref 74–109)
Glucose: 166 mg/dL — ABNORMAL HIGH (ref 74–109)
Potassium: 4.8 mmol/L (ref 3.4–5.1)
Potassium: 4.9 mmol/L (ref 3.4–5.1)
Sodium: 134 mmol/L — ABNORMAL LOW (ref 136–145)
Sodium: 134 mmol/L — ABNORMAL LOW (ref 136–145)
Urea Nitrogen, Blood (BUN): 24 mg/dL — ABNORMAL HIGH (ref 6–20)
Urea Nitrogen, Blood (BUN): 20 mg/dL (ref 6–20)

## 2020-07-09 LAB — LACTIC ACID, WHOLE BLD VENOUS: Lactic Acid, Whole Bld Venous: 1 mmol/L (ref 0.9–1.7)

## 2020-07-09 LAB — BLD GAS VENOUS
Base Excess, Ven: -2 mEq/L (ref <=2)
HCO3, Ven: 24 mEq/L (ref 20–28)
O2 Sat, Ven: 78 % (ref 70–100)
PCO2, Ven: 45 mm Hg (ref 35–50)
PO2, Ven: 44 mm Hg (ref 30–55)
pH, VEN: 7.33 (ref 7.30–7.40)

## 2020-07-09 MED ORDER — HYDROMORPHONE 1 MG/ML INJECTION SYRINGE
0.2000 mg | INJECTION | INTRAMUSCULAR | Status: DC | PRN
Start: 2020-07-09 — End: 2020-07-13

## 2020-07-09 MED ORDER — NALOXONE 0.4 MG/ML INJECTION SOLUTION
0.1000 mg | INTRAMUSCULAR | Status: DC | PRN
Start: 2020-07-09 — End: 2020-07-13

## 2020-07-09 MED ORDER — OXYCODONE 5 MG TABLET
10.0000 mg | ORAL_TABLET | ORAL | Status: DC | PRN
Start: 2020-07-09 — End: 2020-07-13
  Administered 2020-07-10 – 2020-07-12 (×6): 10 mg via ORAL
  Filled 2020-07-09 (×6): qty 2

## 2020-07-09 MED ORDER — FAMOTIDINE 20 MG TABLET
20.0000 mg | ORAL_TABLET | Freq: Two times a day (BID) | ORAL | Status: DC | PRN
Start: 2020-07-09 — End: 2020-07-13
  Administered 2020-07-10: 20 mg via ORAL
  Filled 2020-07-09: qty 1

## 2020-07-09 MED ORDER — OXYCODONE 5 MG TABLET
5.0000 mg | ORAL_TABLET | ORAL | Status: DC | PRN
Start: 2020-07-09 — End: 2020-07-13
  Administered 2020-07-09 – 2020-07-12 (×8): 5 mg via ORAL
  Filled 2020-07-09 (×8): qty 1

## 2020-07-09 MED ORDER — OXYCODONE 5 MG TABLET
5.0000 mg | ORAL_TABLET | ORAL | Status: DC | PRN
Start: 2020-07-09 — End: 2020-07-13
  Administered 2020-07-09: 5 mg via ORAL
  Filled 2020-07-09: qty 1

## 2020-07-09 NOTE — Nurse Assessment (Signed)
ASSESSMENT NOTE    Note Started: 07/09/2020, 19:29     Initial assessment completed and recorded in EMR.  Report received from night shift nurse and orders reviewed. Plan of Care reviewed and updated and appropriate, discussed with patient.  Isidore Moos, RN RN

## 2020-07-09 NOTE — Care Plan (Signed)
Unable to assess patient for Pulmonary Hygiene Post Assessment Progress score due to RT workload. Plan to continue current therapy and reassess tomorrow.     Roderick Pee RRT

## 2020-07-09 NOTE — Clinical Case Management (Signed)
Clinical Case Management Assessments    Name: Cristina Carter  MRN: [id number]   Date of Birth: [birth year] (31yr Gender: female    Note Date: 07/09/2020 Note Time: 17:58       INITIAL ASSESSMENT NOTE    [facility]  [facility] Letter: Not Applicable    Permanent Address:   [address] [place] Discharge Address: same as above    Patient can follow-up with:   PCP: LColbert Coyer PA  / Phone Number: [phone] [phone]      Funding/Billing: Payor: MEDICARE / Plan: MEDICARE PART B ONLY  [facility]     Reason for admission: right periprosthetic hip fracture after GLF.    Patient able to participate in plan?: Yes  Living arrangements: Children  Type of Residence: private residence  Home Environment (floors/stairs): single storey with one small step to enter  Who will be the primary caregiver after discharge?  Phone #? : SNyara Border(son)- [phone] If 24 hr. supervision is required, who will be the additional support person(s)?  Phone #?: daughter in law and grandkids are able to help  Is caregiver willing/able to assist this patient with daily activities? (feeding, transferring in/out of bed/chair, dressing, bathing & other daily activities?: Yes  Is caregiver willing/able to receive training to support this patient for above activities?: Yes  Developmental Level Appropriate (Pediatrics): N/A  CCS (Pediatrics): N/A  Pre-Hospitalization self-care deficits: None  Pre-Hospitalization mobility: Independent, Modified Independent  Bladder function: Continent  Bowel function: Continent  Pre-Hospital Services: None  Type of home health care services in place: None  DME in place: None  Patient's goal upon discharge (in patient's own words) : To go home  Anticipated Preliminary Discharge Disposition: Home with  home health  Does the patient have ongoing DC Planning needs?: TBD  Is the patient ready for discharge today?: No     No post-discharge plans have been finalized at this time.    Comments:   Ortho Trauma  Procedures:    7/2 (GS): ORIF R femur for periprosthetic THA fracture    Spoke to son over the phone and verified demographics and support at home.  Patient has a good family support. At baseline, patient is independent most of the time with minimal assistance if needed. Discharge plan is to go home with a lot of family members to help per son. Family will provide transport.       Follow-up/recommendations:  Anticipate home with home health  PT recommends use of FWW.      07/09/2020 18:00   Electronically Signed by:   RLoletta Specter RN, BSN  Clinical Case Manager   Department of Clinical Case Management  Phone No. [phone] Fax No. [phone] Pager No. [phone] E-Mail: [email or web address]  : Shanelle Clontz  Regular schedule: M-F 8:00am to 4:30pm

## 2020-07-09 NOTE — Progress Notes (Signed)
Orthopaedic Trauma Progress Note  Date: 07/09/2020  Unit: [id number]  Attending of Record: Attending Provider: Quentin Angst, MD    ID:  Cristina Carter is a 80yrold female with right periprosthetic hip fracture after GLF.      Procedures:    7/2 (GS): ORIF R femur for periprosthetic THA fracture    Interval Events/Subjective:  Dressing saturated overnight. Replaced this morning, small amount of bleeding from proximal aspect of incision.   Otherwise, pain controlled, no issues.     Objective:  Temp src: Oral (07/03 1127)  Temp:  [36.7 C (98.1 F)-37.9 C (100.3 F)]   Pulse:  [81-94]   BP: (97-152)/(59-82)   Resp:  [12-16]   SpO2:  [89 %-100 %]   I/O Last 2 Completed Shifts:  In: 1974.5 [Oral:880; Crystalloid:1094.5]  Out: 700 [Urine:400; Other:300]     PE:  Gen: NAD    Right lower extremity:  Inspection: skin intact  Drain: none  Vascular: palpable dorsalis pedis pulse, toes warm and well perfused, capillary refill < 2s  Motor function: intact DF/PF/EHL  Sensory: S/S/SP/DP/T intact to light touch    Laboratory Tests:  Lab Results   Lab Name Value Date/Time    WBC 7.5 07/09/2020 01:20 AM    HGB 9.1 (L) 07/09/2020 01:20 AM    HCT 25.5 (L) 07/09/2020 01:20 AM    PLT 183 07/09/2020 01:20 AM     Lab Results   Lab Name Value Date/Time    NA 134 (L) 07/09/2020 01:20 AM    K 4.8 07/09/2020 01:20 AM    CL 103 07/09/2020 01:20 AM    CO2 19 (L) 07/09/2020 01:20 AM    BUN 24 (H) 07/09/2020 01:20 AM    CR 1.05 07/09/2020 01:20 AM    GLU 177 (H) 07/09/2020 01:20 AM       Assesment and Plan:  Cristina Carter a 89yrld female with [place] B1 periprosthetic femur fracture.      # R periprosthetic femur fracture  - Dressing: none  - Drains: none  - Weight bearing status: WBAT RLE  - Imaging: preop complete  - Cultures: none  - Antibiotics: 24h abx (complete 7/3)    General:  - Pain Management: PO with IV rescue  - PT/OT  - Physical Precautions: none  - Foley: none  - DVT Prophylaxis: LMWH    Dispo: pain  control, physical therapy        Electronically signed by:      GrLenon CurtMD PGY-2  Pager: [phone]: [phone] Surgery      Orthopaedic Trauma Contact List:     Primary/1st Call:   FOR PATIENTS ON D14 AND D11 PLEASE PAGE: NP [phone]/week 7am-6pm)  FOR PATIENTS ON ALL OTHER FLOORS PLEASE PAGE: NP [phone] (7d/week 7am-6pm)  2nd Call: Randhawa [phone]   3rd Call: McKeithan [phone]  4th Call: HaLaverta [place]2961  5th Call: BaDenny [phone]-cover: [phone]-cover is primary contact 7d/week 6pm-7am  (This service has an in house resident covering all orthopedic patients. Please limit pages between the hours of 6pm-6am to critical issues)

## 2020-07-09 NOTE — Discharge Planning (AHS/AVS) (Signed)
Here are some community resources should you need them in the future:    If you have questions about your private medical insurance including [employer] plans:  Call the customer service number located on your insurance card.    Community Resources [place]  www[place]    Medicare   www.medicare.gov  (800) 633-4227    [place] Department of Aging (CDA)  www.aging.[place].gov  (800) 510-2020    Medi-Cal  [phone]) 874-3100 For [place] only   For all other counties, refer to website  www.dhs.[place].gov/services/medi-cal    Long Term Care Ombudsman  Crisis Line  [phone]) [phone]    If you have any further questions related to this matter, you can contact Clinical Case Management at [phone].

## 2020-07-09 NOTE — Progress Notes (Signed)
ORTHOPEDIC CO-MANAGEMENT DAILY PROGRESS NOTE    Date: 07/09/2020 Time: 14:18    HANDOFF SUMMARY  80yrold female patient with history of HTN presenting with R hip fracture.  Edited by: SLorn Junes MD at 07/07/2020 [phone]   ------------------------------------------------------------------------------------------------------------------  ASSESSMENT AND PLAN  89yrld female patient with history of HTN presenting with R hip fracture.  Edited by: SrLorn JunesMD at 07/07/2020 [phone]  Summary of Recommendations:  1. Started on famotidine Q12H PRN for heartburn. Patient reports taking some sort of antacid at home.     #R Periprosthetic hip fracture  -Defer to ortho for management    # GERD  - started on famotidine Q12H PRN  - could consider starting PPI vs defer to outpatient setting    #Elevated lipase  No clinical correlation-- no abd pain, no imaging abnormalities.   - No further work up; monitor    #Osteoporosis  #Fragility fracture  #Pathologic Fracture  PTH wnl, vit D, 25-OH wnl, phos WNL, [place] wnl.              -Physical therapy outpatient (SNF vs. HH vs. Outpatient)             -Include patient education with dot phrase .FRAGILITYFXDC in patient discharge instructions  -PCP should order baseline DEXA scan outpatient if not completed in the past year  -PCP should order bone health labs (Ca, Vit D) 3 months after discharge  - Based on Vit D level, will order supplementation; level normal               #HTN  - Continue amlodipine and losartan    #Anemia, normocytic  -Monitor post op, transfuse if Hgb <7    FEN: SPECIAL TRAY REQUEST  MECHANICAL SOFT DIET (INTACT OR CHOPPED)  Last BM: Last Bowel Movement: 07/05/20 (per pt report)  DVT Prophylaxis: VTE prophylaxis contraindicated 2/2 surgery  Disposition: pending PT/OT eval    ------------------------------------------------------------------------------------------------------------------    SUBJECTIVE  Patient seen with spanish interpreter via  [facility]. Feels fine. Endorses some leg pain and some epigastric pain. Denies any constipation. Has not had a BM yet but passing flatus.     REVIEW OF SYSTEMS  Constitutional: negative.  Eyes: negative.  Ears, Nose, Mouth, Throat: negative.  CV: negative.  Resp: negative.  GI: negative.  GU: negative.  Musculoskeletal: negative.  Integumentary: negative.  Neuro: negative.  Psych: Mood pt's report, euthymic.     Medications  Acetaminophen (TYLENOL) Tablet 1,000 mg, ORAL, Q8H  Amlodipine (NORVASC) Tablet 5 mg, ORAL, QAM  Atorvastatin (LIPITOR) Tablet 10 mg, ORAL, Daily Bedtime  Enoxaparin (LOVENOX) Injection 30 mg, SUBCUTANEOUS, Q12H  Losartan (COZAAR) Tablet 100 mg, ORAL, QAM  Polyethylene Glycol 3350 (MIRALAX) Oral Powder Packet 17 g, ORAL, QAM  Sennosides (SENOKOT) Tablet 17.2 mg, ORAL, Daily Bedtime        FamoTIDine (PEPCID) Tablet 20 mg, ORAL, Q12H PRN  Oxycodone (ROXICODONE) Tablet 5 mg, ORAL, RESCUE Q4H PRN   Or  Hydromorphone (DILAUDID) Injection 0.2 mg, IV, RESCUE Q2H PRN  Naloxone (NARCAN) Injection 0.1 mg, IV, Q2MIN PRN  Oxycodone (ROXICODONE) Tablet 5 mg, ORAL, Q4H PRN   Or  Oxycodone (ROXICODONE) Tablet 10 mg, ORAL, Q4H PRN        OBJECTIVE    Vitals Signs  Current Vitals  Temp: 36.8 C (98.2 F)  BP: 137/66  Pulse: 88  Resp: 16  SpO2: 94 %  Flow (L/min): 2  Weight: 73.6 kg (162 lb  4.1 oz)    Intake and Output  Last Two Completed Shifts  In: 1974.5 [Oral:880; Crystalloid:1094.5]  Out: 700 [Urine:400; Other:300]    Current Shift  In: 600 [Oral:600]  Out: 100 [Urine:100]    PHYSICAL EXAM   Gen: Elderly woman appears stated age, in no acute distress  HEENT: EOMI, anicteric sclera, NCAT  Neck: supple  CV: systolic murmur heard at RUSB, no other rubs or gallops, regular rhythm  Lungs:CTAB  Abdo: +BS, soft, mildly TTP in the epigastrium but without rebound or guarding  Ext: No edema    Lines and Drains     Incision/Wound/Pressure Injury 07/08/20 Incision Leg Right (Active)       Peripheral IV  07/08/20 Anterior;Right Forearm (Active)        Labs and Studies  I have reviewed the following information from the last 24 hours: allied health and treating physician notes, imaging and labs and microbiology data    Please see top of the note for assessment and plan    Report Electronically Signed by:   Caswell Corwin, MD   Associate Physician  [facility]  Pager: [phone]  PI: [id number]    Date: 07/09/2020 Time: 14:18

## 2020-07-09 NOTE — Care Plan (Signed)
Problem: Adult Inpatient Plan of Care  Goal: Plan of Care Review  Outcome: Ongoing, Progressing  Flowsheets (Taken 07/09/2020 1556)  Plan of Care Reviewed With:   patient   son  Progress: improving  Outcome Evaluation: Pt A/Ox4 and is Spanish speaking. Martti available at bedside for use. VSS, on RA. Pt c/o pain LLE 6-10/10 throughout shift so, PRN oxy given Q4hrs. '5mg'$  oxy rescue dose given x1. Neuro and NV checks done Q 6hrs. Dressing to R hip/leg changed by MD today at bedside and is c/d/i. Pt voids using bedpan. No BM this shift, passing gas. Repositioning done Q2-3hrs with lift team/x2 person assist although pt is able to help with repositioning. Pt worked with PT today and stood at the edge of the bed and was able to take a few steps to the side. IVF infusing at 33m/hr. Last abx given as ordered. Plan to continue working with PT and adequate pain control.

## 2020-07-09 NOTE — Allied Health Consult (Signed)
[facility] -- ACUTE CARE SERVICE    PHYSICAL THERAPY EVALUATION      Name: Cristina Carter   MRN: [id number]   Date of Service: 07/09/2020  Hospital Unit: D14  Time In: 1335    Time for Eval: 40 minutes    Was a new therapy order generated with a "Pending Discharge" priority designation?  No. A new PT/OT order was issued and it is APPROPRIATE because the patient needs PT/OT services &/or a functional mobility assessment AND they are expected to be stable/available for PT services within the next 24 hours.                                                                                                                                                INTAKE INFORMATION AND HISTORY:                       Authorizing Provider and PI #: MEGAN RENEE TERLE [id number]  Primary Service: (A) Orthopedics   Date of Admission: 07/06/2020  Diagnosis: R femur periprosthetic fx sustained in GLF  Language: Spanish    Precautions: Fall risk  Weight Bearing As Tolerated RLE  Isolation Precautions: Personal Protective Equipment Utilized: Gloves and Surgical Mask    History of Present Illness/Injury (Including pertinent test results & procedures):    Per MD notes: Cristina Carter is a 80yrold [facility]. We recommend operative stabilization of fracture. This was discussed with the patient utilizing a Spanish Interpretor. She has consented to surgery. We have asked Medicine Hospitalist Team to assist in her co-management [facility]   OPERATIVE REPORT  Preoperative diagnosis: Right periprosthetic femur fracture  Postoperative diagnosis: Right periprosthetic femur fracture  Procedure: ORIF Right femur    Past Medical History:    Past Medical History:   Diagnosis Date    Chronic kidney disease (CKD), stage IV (severe) (HCC)     Diabetes [place]     Hypertension          Past Surgical History  Past Surgical History:   Procedure Laterality Date    EGD  10/14/2018    ERCP   10/14/2018          Social History:    Social History     Tobacco Use    Smoking status: Never Smoker    Smokeless tobacco: Never Used   Substance Use Topics    Alcohol use: Never    Drug use: Never       PHYSICAL THERAPY EXAMINATION:  RN cleared patient to work with PT.        07/09/20 1335   Physical Therapy Time and Intention   Mode of Treatment co-treatment;physical therapy;occupational therapy   Total Minutes, Physical Therapy 40   Subjective "I just have a  lot of pain and they gave me something and now I'm sleepy." agreeable and cooperative; Martti translator Barnabas Lister used for Spanish   Pain   Comment, Pain 7/10 R LE; RN aware   Living Environment   Current Living Arrangements home/apartment/condo   Comments, Living lives with son and daughter-in-law in single story home with small step to enter, grandkids in area, daughter will be staying with her   Comments, Home Accessiblity f   Comment, Availability of caregiver support family able to provide any needed A   Primary Bathroom Access   Bathroom Access tub shower   Prior Level of Function   Prior Level of Function  Modified independent for ambulation;Independent with BADL's   Which direction does the pt normally exit bed? right   Comment, Prior Level of Function uses rollator for community mobility, does not use in house; independent but slow for ADLs   Home Use of Assistive/Adaptive Equipment   Home Equipment Type Adult Standard (Pt height 5'3"- 6'4")   Durable Medical Equipment Used at Home walker, 4-wheeled   Comment, Home Equipment rollator is in [place] in the last 6 months yes   How many falls have you had? 1   Any injury as result of your fall? yes   Comment, Fall current admission for R femur periprosthetic fx sustained in fall over case of bottled water   Observation   General Observations of Patient alert, elderly female lying supine, stocky build, apprehensive about moving due to pain; IV   Vitals Comments   Comment,  Blood Pressure 157/67 in sitting   Comment, SpO2 94%   Cognition   Cognitive Function WNL   Edema   Comment, Edema LE  mild edema R LE   Sensory Assessment (Somatosensory)   Sensory Assessment (Somatosensory) sensation intact   Range of Motion (ROM)   RUE WFL   LUE WFL   RLE WFL, except;hip;knee   Comment, ROM: Right Lower Extremity limited due to pain   LLE WFL   Manual Muscle Testing   RUE WFL   LUE WFL   RLE   (grossly 3-/5)   Comment, MMT: Right Lower Extremity limited due to pain   LLE WFL   Bed Mobility   Scooting, Supine Assistance (Bed Mobility) verbal cues;nonverbal cues (demo/gesture);minimal assist (75-99% patient effort)   Scooting, Edge of Bed Assistance (Bed Mobility) verbal cues;nonverbal cues (demo/gesture);maximum assist (25-49% patient effort);moderate assist (50-74% patient effort)   Supine-Sit Independence (Bed Mobility) verbal cues;nonverbal cues (demo/gesture);moderate assist (50-74% patient effort)   Sit-Supine Independence (Bed Mobility) verbal cues;nonverbal cues (demo/gesture);minimal assist (75-99% patient effort)   Assistive Device (Bed Mobility) HOB elevated;bed rails;draw sheet   Which direction did they exit bed today? right   Comment, (Bed Mobility) goes through spin pivot for supine/sit, needs A for R LE and to bring trunk to upright sitting; able to use trapeze and move herself up in bed if R LE is supported; needs cues for hand placement   Transfers   Sit-Stand Independence (Transfers) verbal cues;nonverbal cues (demo/gesture);minimal assist (75-99% patient effort)   Comment, Sit-Stand Transfer needs cues for hand placement   Stand-Sit Independence (Transfers) verbal cues;nonverbal cues (demo/gesture);contact guard   Comment, Stand-Sit Transfer needs A to guide hips and control descent   Assistive Device Size (Transfer) Adult Standard (Pt height 5'3"- 6'4")   Assistive Device (Transfers) walker, front-wheeled   Gait Assessment   Independence Level (Gait) verbal cues;nonverbal cues  (demo/gesture);minimal assist (75-99%  patient effort)   Assistive Device Size (Gait) Adult Standard (Pt height 5'3"- 6'4")   Assistive Device (Gait) walker, front-wheeled   Distance in Feet (Gait) 2   Pattern (Gait) step-to   Deviations/Abnormal Patterns (Gait) right sided deviations;gait speed decreased   Right Sided Gait Deviations antalgic;poor foot clearance   Comment, Gait has difficulty bearing full wt on R LE due to pain; takes a few steps along bedside   Balance   Static Sitting Balance modified independence;sitting, edge of bed   Dynamic Sitting Balance standby assist;sitting, edge of bed   Static Standing Balance contact guard   Dynamic Standing Balance minimal assist (75-99% patient effort)   Comment, Standing Balance needs FWW for safety   Activity Tolerance   Activity Tolerance poor   Comment, Activity Tolerance significantly limited by R LE pain   Safety Issues, Functional Mobility   Safety Issues Affecting Function (Mobility) friction/shear risk   Impairments Affecting Function (Mobility) balance;endurance/activity tolerance;pain;range of motion (ROM);strength   Comment, Safety Issues/Impairments (Mobility) fall risk         No additional interventions beyond this evaluation were provided to the patient at this time.        Patient Status after PT session: Pt positioned supine, bed low/locked; table and call light within reach. Care transitioned to RN present at bedside.      ASSESSMENT / RECOMMENDATIONS:    Physical Therapy Assessment:  The patient is a 80yrold [facility]  She presents with the following problems: at-risk for falls, balance impairment, difficulty performing ADLs, gait abnormality, limited activity tolerance, pain in R LE, and weakness. This patient will benefit from  PT services at this time to address these problems, so that they may maximize their recovery of safe functional mobility and independence.    Other Consults Requested: N/A    Physical Therapy Recommendations for Nursing:  Assist with re-positioning in bed every two hours with patient activating initial movements.  Assist patient with transfer to chair, BSC via [place] mod/min A  2-3X/day. Increase tolerance as able. Encourage supine exercises and seated exercises (ankle pumps, heel slides, quad sets, gluteal sets, and long arc quads) 2-3X/day.     CURRENT Discharge Recommendations:        Disposition: Home with Assistance--multiple family members able to provide 24/7 care        Is Patient Cleared for Disposition above: no - needs to be more independent with mobility and to have better pain control  Current Level of Assistance or Supervision:  Moderate Assist for ADLs/Mobility  Continued Physical Therapy: Home Health PT following continued acute therapy services                            Equipment Size: Youth (Pt height 4'7"-5'3")        Equipment:  FWW: Front wheel walker        Referral Recommendations (for follow up): N/A    Patient / Caregiver Education Today:   Physical Therapy Plan of Care/Consent & Recommendations (listed above)      Method of Teaching: Verbal and Demonstration    Learner: Patient    Response: Verbalizes understanding and Needs more practice    GOALS / TREATMENT PLAN / FUNCTIONAL PROGNOSIS:  Patient's Goals: "just to get better"    Physical Therapy Goals:    Bed Mobility: Pt will improve from mod A to CGA,  without use of bed features, for supine/sit and moving up in bed.  Transfers: Pt will improve from min A to CGA for bed, chair, toilet, and FWW transfers.  Gait: Pt will walk 75 ft with FWW and CGA.  Patient Education: Pt will demonstrate appropriate safety awareness during all mobility to decrease fall risk.   Caregiver Education: Caregiver will safely provide any needed physical A.      Prognosis:   Good for stated goals  Barriers that may interfere with PT POC: Pain and Language Barrier      Treatment Plan:    Bed Mobility Film/video editor Exercise  Endurance Training  Patient Education  Armed forces training and education officer / Administrator, Civil Service Recommendations     Recommended Frequency of Treatment: 3-5 days/week    Recommended Duration of Treatment: 2 Weeks, then re-assess    Interim Report Due:  07/23/2020       Patient's Clinical Presentation: Stable/Uncomplexed   The components of this evaluation necessitated a High complexity level of clinical decision making.     Reported by:  Anderson Malta L. Melina Copa PT  PI# [phone] [id number]  Vocera [phone]

## 2020-07-09 NOTE — Nurse Assessment (Signed)
ASSESSMENT NOTE    Note Started: 07/09/2020, 07:40     Initial assessment completed and recorded in EMR.  Report received from night shift nurse and orders reviewed. Plan of Care reviewed and appropriate, discussed with patient. Pt awake and alert in no acute distress. Pt c/o pain R hip, reviewed pain management plan with pt. Plan for PT and adequate pain control.  Michel Harrow,  RN

## 2020-07-09 NOTE — Allied Health Consult (Signed)
PM&R -- ACUTE CARE SERVICE  OCCUPATIONAL THERAPY EVALUATION WITH ADDITIONAL TREATMENT PROVIDED [UNIT D14]      Patient Name: Cristina Carter   MRN:   [id number]   Date of Admission: 07/06/2020  Date of Onset:  6//30/2022  Diagnosis: R femur ORIF  Prior treatment has not been provided for this diagnosis.  Authorizing Physician (First and Last Name) and PI#: Dr. Casey Burkitt  Primary Service: (A) Orthopedics  Language: Spanish, Vinie Sill used for Best boy Utilized: Higher education careers adviser Minutes: 15  Total Treatment Minutes: 20  ----------------------------------------------------------------------------------------------------------------------------------------------------------------------------  CURRENT Discharge Recommendations:         Current Functional Level: Minimal/Limited assistance        Equipment Needs Upon Discharge: Commode Chair and Dressing Equipment        Anticipated Discharge Disposition: Home with Assist or Home with Home Health OT   ----------------------------------------------------------------------------------------------------------------------------------------------------------------------------    History of Present Illness/Injury (Including pertinent test results & procedures):   Per MD Note: Cristina Carter is a 80yrold female with right periprosthetic hip fracture after GLF.      Procedures:    7/2 (GS): ORIF R femur for periprosthetic THA fracture      Past Medical History:   Past Medical History:   Diagnosis Date    Chronic kidney disease (CKD), stage IV (severe) [place]     Diabetes mellitus ([place]     Hypertension         Past Surgical History:  Past Surgical History:   Procedure Laterality Date    EGD  10/14/2018    ERCP  10/14/2018        Hand Dominance: Right    Objective, Assessment and Plan:     07/09/20 1334   Time and Intention   Document Type evaluation   Mode of Treatment co-treatment;physical  therapy;occupational therapy   Total Minutes, Occupational Therapy 35   Pain   Comment, Pain 7/10 R LE; RN aware   General Information   Prior Level of Function  Modified independent for ambulation;Modified independent for ADLs   General Observations of Patient alert, elderly female lying supine, stocky build, apprehensive about moving due to pain; IV   Existing Precautions/Restrictions fall;weight bearing as tolerated  (WBAT RLE)   Isolation Precautions protective environment maintained   History of Falls in the last 6 months yes   How many falls have you had? 1   Any injury as result of your fall? yes   Comment, Fall current admission for R femur periprosthetic fx sustained in fall over case of bottled water   Subjective   Subjective RN cleared pt for OT eval, pt apprehensive about moving. Stating repeatedly that her RLE has pain.   Occupational Profile   Successful Occupations (Occupational Profile) ADL   Environmental Supports and Barriers (Occupational Profile) [place] in [place]'s [place]   Home Use of Assistive/Adaptive EEnterpriseUsed at [employer] 4-wheeled   Comment, Home Equipment [phone] repair   Bathroom   Location, Bathroom first (main) floor   Bathroom Access tub shower   Bathroom Access Comment [place] in [place]ap[place]condo   Comments, Living lives with son and daughter-in-law in single story home with small step to enter, grandkids in area, daughter will be staying with her   Vision Assessment/Intervention   Visual Impairment/Limitations  WFL   Sensory Assessment (Somatosensory)   Sensory Assessment (Somatosensory) sensation intact   Hearing Assessment   Hearing Status WFL   Range of Motion (ROM)   RUE WFL   LUE WFL   RLE WFL, except;hip   Comment, ROM: Right Lower Extremity limited by pain   LLE WFL   Edema   Comment, Edema mild swelling noted in  R knee   Manual Muscle Testing   RUE WFL   LUE WFL   Bed Mobility   Supine-Sit Independence (Bed Mobility) verbal cues;nonverbal cues (demo/gesture);moderate assist (50-74% patient effort)   Sit-Supine Independence (Bed Mobility) verbal cues;nonverbal cues (demo/gesture);minimal assist (75-99% patient effort)   Assistive Device (Bed Mobility) HOB elevated;bed rails   Which direction did they exit bed today? right   Which direction does the pt normally exit bed? right   Comment, (Bed Mobility) assist given to RLE due to pain, and assist at trunk. Uses flat spin tech to get out of bed. Uses trapeze to assist with  repositioning once back in bed   Functional Mobility   Sit-Stand Independence (Transfers) verbal cues;nonverbal cues (demo/gesture);minimal assist (75-99% patient effort)   Stand-Sit Independence (Transfers) verbal cues;nonverbal cues (demo/gesture);contact guard   Safety Issues, Functional Mobility   Safety Issues Affecting Function (Mobility) friction/shear risk   Impairments Affecting Function (Mobility) balance;endurance/activity tolerance;pain;range of motion (ROM);strength   Lower Body Dressing Assessment/Training   Independence Level (Lower Body Dressing) dependent (<25% patient effort);don;doff;socks   Position (Lower Body Dressing) supine   Balance   Static Sitting Balance modified independence   Dynamic Sitting Balance standby assist;sitting, edge of bed   Static Standing Balance contact guard   Dynamic Standing Balance minimal assist (75-99% patient effort)   Comment, Standing Balance used FWW   Activity Tolerance   Activity Tolerance poor   Comment, Activity Tolerance limited by pain in RLE   Therapy Assessment/Plan (OT)   Patient/Family Therapy Goal Statement (OT) to get better   Current Functional Level Extensive assistance   Rehab Potential (OT) good, to achieve stated therapy goals   Therapy Frequency (OT) 3-5 times/wk   Predicted Duration of Therapy Intervention (OT) reassess every 2 weeks  if goals are not met   Problem List (OT) balance;mobility;range of motion (ROM);strength;pain   Activity Limitations Related to Problem List (OT) unable to ambulate safely;unable to transfer safely;BADLs not performed adequately or safely;IADLs not performed adequately or safely   Planned Therapy Interventions (OT) activity tolerance training;adaptive equipment training;BADL retraining;functional balance retraining;occupation/activity based interventions;IADL retraining;patient/caregiver education/training;strengthening exercise;transfer/mobility retraining   Comment, Therapy Assessment/Plan (OT) Pt with GLF resulting in R periprosthetic fx and is not s/p ORIF and WBAT RLE. Pt is limited by pain, decreased balance, decreased strength and endurance, and is not at baseline for ADL and mobility.   OT Goals   Transfer Goal Selection (OT) transfer, OT goal 1   Dressing Goal Selection (OT) dressing, OT goal 1   Toileting Goal Selection (OT) toileting, OT goal 1   Transfer Goal 1 (OT)   Progress/Outcome (Transfer Goal 1, OT) goal ongoing   Independence Level/Cues Needed (Transfer Goal 1, OT) supervision required   Activity/Assistive Device (Transfer Goal 1, OT) transfers, all;walker, standard   Time Frame (Transfer Goal 1, OT) by discharge   Dressing Goal 1 (OT)   Activity/Device (Dressing Goal 1, OT) lower body dressing   Strategies/Barriers (Dressing Goal 1, OT) with LH AE as needed   Progress/Outcome (Dressing Goal 1, OT) goal ongoing   Time Frame (Dressing Goal 1,  OT) by discharge   Independence/Cues Needed (Dressing Goal 1, OT) supervision required   Toileting Goal 1 (OT)   Activity/Device (Toileting Goal 1, OT) toileting skills, all   Progress/Outcome (Toileting Goal 1, OT) goal ongoing   Time Frame (Toileting Goal 1, OT) by discharge   Independence Level/Cues Needed (Toileting Goal 1, OT) supervision required   Evaluation Complexity   Overall Complexity of Evaluation (OT) moderate complexity   Therapy Plan  Review/Discharge Plan (OT)   Comment, Plan of Care pt will benefit from skilled OT intervention to address deficits   Therapy Plan Review (OT) evaluation/treatment results reviewed;care plan/treatment goals reviewed;participants included;patient   Equipment Needs Upon Discharge (OT) commode chair;dressing equipment   Patient/Family Concerns, Equipment Needs at Discharge (OT) pt would like family to assist her at home   Anticipated Discharge Disposition (OT) home with home health OT;home with assist       Patient / Caregiver Education Today: OT role, POC              Method of Teaching: verbal             Learner: patient            Response: verbalizes understanding and able to give return demonstration    Interim Report Due:  07/23/2020    Plan of Care Re-Certification Due (90 Days from original Plan of Care, if no major change.  90 days from today if Plan of Care re-sent due to major change): 10/07/2020        Additional Treatment Rendered    Total Treatment Time:15    S: Pt agreeable to OT, apprehensive about movement.     O: Transfer training     A: Pt with no adverse affects from session.     P: OT POC as indicated above.     Was a new therapy order generated with a "Pending Discharge" priority designation?  No. A new PT/OT order was issued and it is APPROPRIATE because the patient needs PT/OT services &/or a functional mobility assessment AND they are expected to be stable/available for PT services within the next 24 hours.    Report Electronically Signed By:      Kyung Rudd, OTR/L  Occupational Therapist II   PI # [id number]  [facility]  Department of PM&R  [place]

## 2020-07-09 NOTE — Care Plan (Signed)
Problem: Adult Inpatient Plan of Care  Goal: Plan of Care Review  Outcome: Ongoing, Progressing  Flowsheets (Taken 07/09/2020 0503)  Plan of Care Reviewed With: patient  Progress: improving  Outcome Evaluation: A&Ox4. Slept between cares. VSS at RA. Oxy 10 given around clock for pain. No neurovascular changes. Drsg to R hip c/d/i. Voided using bedpain. No BM. Reposition Q2-Q3 hours. Plan to work with PT today.

## 2020-07-09 NOTE — Allied Health Consult (Signed)
NUTRITION EDUCATION    Admission Date: 07/06/2020   Date of Service: 07/09/2020, 09:21     Consult received to assess vitamin D and calcium status secondary to fragility fracture    Used Spanish interpretor (ID: [id number]) via Martti. Just came back from xray, reports being in pain during visit. States good appetite PTA, denies changes in weight. Takes Centrum Silver daily (has 220 mg Ca), no other supplements.    Reviewed pertinent:   Labs, Meds, I/Os and nutrition order  Lab Results   Component Value Date    VITD25 30.9 07/06/2020    CA 8.1 (L) 07/09/2020    ALB 4.2 07/06/2020      Corrected Ca: N/A    Diet recall of calcium:   Celise Cliatt reports consuming the following calcium-rich foods:    Calcium source # servings/day   Milk (8 oz) 0   Yogurt (6 oz) 1   Cheese (1 oz or 1 cubic inch) 0   Fortified food or juices 0   Nondairy sources ---   Calcium supplements 220 mg/day       Assessment of calcium intake:  Diet recall indicates insufficient dietary calcium.     Interventions:   1) Education:  - Reviewed Calcium Handout via Spanish interpretor    2) Vitamin and Mineral Supplementation: Calcium   - Please provide 1 tablet of calcium carbonate, 1250 mg BID(each tablet provides 500 mg elemental calcium)    3) Vitamin and Mineral Supplementation: Vitamin D:   Vitamin D Sufficient (30 ng/mL or greater):   - Adults (over 49 years old): Vitamin D3 800 international unit daily       Tolerating oral diet, consuming 75% meals thus far which is likely adequate to meet nutritional needs.      Patient does not present at acute nutrition risk. No further nutrition intervention is warranted at this time. Will continue routine nutrition monitoring. Please consult Registered Dietitian if acute nutrition issues arise.    Report Electronically Signed By: Betsy Pries RD (Pager [phone])  Tiger Text: Darien Ramus  (Covering for: Carlyle Lipa, [place]   Pager [phone])

## 2020-07-10 MED ORDER — MAGNESIUM HYDROXIDE 400 MG/5 ML ORAL SUSPENSION
30.0000 mL | Freq: Two times a day (BID) | ORAL | Status: DC | PRN
Start: 2020-07-10 — End: 2020-07-13
  Administered 2020-07-10 – 2020-07-12 (×2): 30 mL via ORAL
  Filled 2020-07-10 (×2): qty 30

## 2020-07-10 MED ORDER — DOCUSATE SODIUM 100 MG CAPSULE
100.0000 mg | ORAL_CAPSULE | Freq: Two times a day (BID) | ORAL | Status: DC
Start: 2020-07-10 — End: 2020-07-13
  Administered 2020-07-10 – 2020-07-12 (×6): 100 mg via ORAL
  Filled 2020-07-10 (×6): qty 1

## 2020-07-10 MED ORDER — BISACODYL 10 MG RECTAL SUPPOSITORY
10.0000 mg | Freq: Once | RECTAL | Status: AC
Start: 2020-07-10 — End: 2020-07-10
  Administered 2020-07-10: 10 mg via RECTAL
  Filled 2020-07-10: qty 1

## 2020-07-10 NOTE — Clinical Case Management (Signed)
Clinical Case Management    Care Coordination Note      Task Action Result [place] Referral sent to multiple [facility] agencies via [facility]. Awaiting for response. Anson Crofts, Case Manager Assistant  07/10/20 11:28

## 2020-07-10 NOTE — Care Plan (Signed)
Respiratory Care Evaluation - Pulmonary Hygiene Pathway Daily Evaluation  Name: Cristina Carter Admit Date:   07/06/2020  7:04 PM Date of Birth:   [birth year]     Age: 80 yrs Sex: female      Chief Complaint:   Chief Complaint   Patient presents with    *922:Blunt/Critical Trauma Level II       Date of Initial Respiratory Care Evaluation Order: 07/08/2020    Progress Scoring Tool  *Score > 18 indicates readiness to discontinue post-operative chest physiotherapy    Mobility: unable to achieve preoperative mobility status = 0  Breath sounds: slightly decreased breath sounds or presence of minimal adventitia = 2  Secretion clearance: able to clear secretions independently/return = 3  SpO2 at rest and during activity (assessed on baseline/pre-op FiO2): SpO2 >92% (w/o pulmonary hx) or SpO2 >88% (with pulmonary hx) = 3  RR at rest and during activity: within acceptable range for this individual = 3  CXRAY: XRAY WDL for this individual/no Cxray/imaging for 3 days = 2  Incentive spirometry: > 10 mls/kg IBW = 2  Pleural chest tubes: no chest tubes remain = 2    Progress Score: 17    Treatment Plan by RT:   Risk Level: Moderate Risk - Positive pressure therapy; BID for atelectasis, or Acapella Therapy BID for atelectasis and mucokinesis  Modality: PAP  Mouthpiece  Frequency: BID    Treatment Plan by RN:  4x/day deep breathing, I.S., cough/splint, ambulate/chair per pathway.     If patient's condition changes please reorder Respiratory Care Evaluation.      Marcina Millard, RT

## 2020-07-10 NOTE — Progress Notes (Signed)
ORTHOPEDIC CO-MANAGEMENT DAILY PROGRESS NOTE    Date: 07/10/2020 Time: 12:52    HANDOFF SUMMARY  80yrold female patient with history of HTN presenting with R hip fracture.  Edited by: SLorn Junes MD at 07/07/2020 [phone]   ------------------------------------------------------------------------------------------------------------------  ASSESSMENT AND PLAN  89yrld female patient with history of HTN presenting with R hip fracture.  Edited by: SrLorn JunesMD at 07/07/2020 [phone]  Summary of Recommendations:  - No new recs     #R Periprosthetic hip fracture  -Defer to ortho for management    # GERD  - continue famotidine Q12H PRN  - could consider starting PPI vs defer to outpatient setting    #Elevated lipase  No clinical correlation-- no abd pain, no imaging abnormalities.   - No further work up; monitor    #Osteoporosis  #Fragility fracture  #Pathologic Fracture  PTH wnl, vit D, 25-OH wnl, phos WNL, Ry[place] wnl.              -Physical therapy outpatient (SNF vs. HH vs. Outpatient)             -Include patient education with dot phrase .FRAGILITYFXDC in patient discharge instructions  -PCP should order baseline DEXA scan outpatient if not completed in the past year  -PCP should order bone health labs (Ca, Vit D) 3 months after discharge  - Based on Vit D level, will order supplementation; level normal               #HTN  - Continue amlodipine and losartan    #Anemia, normocytic  -Monitor post op, transfuse if Hgb <7    FEN: SPECIAL TRAY REQUEST  MECHANICAL SOFT DIET (INTACT OR CHOPPED)  Last BM: Last Bowel Movement: 07/05/20 (per patient, day of fall)  DVT Prophylaxis: VTE prophylaxis contraindicated 2/2 surgery  Disposition: pending PT/OT eval    ------------------------------------------------------------------------------------------------------------------    SUBJECTIVE  Patient seen with spanish interpreter via [employer]. Feels fine. Endorses leg pain and reports pain control is not  adequate at this time. Asking if she can move her leg. I advised her that it was fine, she could move and weight bear as tolerated.    REVIEW OF SYSTEMS  Constitutional: negative.  Eyes: negative.  Ears, Nose, Mouth, Throat: negative.  CV: negative.  Resp: negative.  GI: negative.  GU: negative.  Musculoskeletal: negative.  Integumentary: negative.  Neuro: negative.  Psych: Mood pt's report, euthymic.     Medications  Acetaminophen (TYLENOL) Tablet 1,000 mg, ORAL, Q8H  Amlodipine (NORVASC) Tablet 5 mg, ORAL, QAM  Atorvastatin (LIPITOR) Tablet 10 mg, ORAL, Daily Bedtime  Docusate (COLACE) Capsule 100 mg, ORAL, BID  Enoxaparin (LOVENOX) Injection 30 mg, SUBCUTANEOUS, Q12H  Losartan (COZAAR) Tablet 100 mg, ORAL, QAM  Polyethylene Glycol 3350 (MIRALAX) Oral Powder Packet 17 g, ORAL, QAM  Sennosides (SENOKOT) Tablet 17.2 mg, ORAL, Daily Bedtime        FamoTIDine (PEPCID) Tablet 20 mg, ORAL, Q12H PRN  Oxycodone (ROXICODONE) Tablet 5 mg, ORAL, RESCUE Q4H PRN   Or  Hydromorphone (DILAUDID) Injection 0.2 mg, IV, RESCUE Q2H PRN  Magnesium Hydroxide (MILK OF MAGNESIA) 400 mg/5 mL Suspension 30 mL, ORAL, Q12H PRN  Naloxone (NARCAN) Injection 0.1 mg, IV, Q2MIN PRN  Oxycodone (ROXICODONE) Tablet 5 mg, ORAL, Q4H PRN   Or  Oxycodone (ROXICODONE) Tablet 10 mg, ORAL, Q4H PRN        OBJECTIVE    Vitals Signs  Current Vitals  Temp: 37.2 C (  34 F)  BP: 128/72  Pulse: 99  Resp: 16  SpO2: 96 % (after IS)  Flow (L/min): 2  Weight: 73.6 kg (162 lb 4.1 oz)    Intake and Output  Last Two Completed Shifts  In: 1487 [Oral:750; Crystalloid:737]  Out: 300 [Urine:300]    Current Shift  In: 626 [Oral:360; Crystalloid:266]  Out: 325 [Urine:325]    PHYSICAL EXAM   Gen: Elderly woman appears stated age, in no acute distress  HEENT: EOMI, anicteric sclera, NCAT  Neck: supple  CV: systolic murmur heard at RUSB, no other rubs or gallops, regular rhythm  Lungs:CTAB  Abdo: +BS, soft, mildly TTP in the epigastrium but without rebound or  guarding  Ext: No edema    Lines and Drains     Incision/Wound/Pressure Injury 07/08/20 Incision Leg Right (Active)       Peripheral IV 07/08/20 Anterior;Right Forearm (Active)        Labs and Studies  I have reviewed the following information from the last 24 hours: allied health and treating physician notes, imaging and labs and microbiology data    Please see top of the note for assessment and plan    Approximately 15 minutes were spent face to face with the patient, of which more than 50% was spent in counseling and/or coordination of care on 07/10/20. The patient understands and agrees with the plan of care as outlined       Report Electronically Signed by:   Caswell Corwin, MD   Associate Physician  [facility]  Pager: [phone]  PI: [phone]    Date: 07/10/2020 Time: 12:52

## 2020-07-10 NOTE — Care Plan (Signed)
Problem: Adult Inpatient Plan of Care  Goal: Plan of Care Review  Outcome: Ongoing, Progressing  Flowsheets (Taken 07/10/2020 1759)  Plan of Care Reviewed With: patient  Progress: improving  Outcome Evaluation: A&Ox4, VSS. No PT today but pt using trapeze more to weight shift in bed. Martti used for [employer]. Pain more controlled with dose of Oxycodone 10 mg. Voids to bedpan. Suppository and MOM given. Small BM - continue bowel regimen. Supportive family visited. Plan is pain management and PT so pt can progress home.

## 2020-07-10 NOTE — Nurse Assessment (Signed)
ASSESSMENT NOTE    Note Started: 07/10/2020, 09:00     Initial assessment completed and recorded in EMR.  Report received from night shift nurse and orders reviewed. Plan of Care reviewed and appropriate, discussed with patient. \    Pt A&Ox4, VSS. Martti used for [place] interpreter. C/o pain to RLE surgical site and R knee pain. Dressing C,D,I. Pain meds reviewed, pt agreeable to 2 tabs Oxycodone. Plan to give suppository today per pt request d/t constipation with no BM after surgery. Call light in reach.     Carrington Clamp, RN

## 2020-07-10 NOTE — Progress Notes (Signed)
Orthopaedic Trauma Progress Note  Date: 07/10/2020  Unit: [id number]  Attending of Record: Attending Provider: Quentin Angst, MD    ID:  Cristina Carter is a 80yrold female with right periprosthetic hip fracture after GLF.      Procedures:    7/2 (GS): ORIF R femur for periprosthetic THA fracture    Interval Events/Subjective:  Patient said she hasn't walked yet because of pain level.    Objective:  Temp src: Oral (07/04 0330)  Temp:  [36.1 C (97 F)-37.7 C (99.8 F)]   Pulse:  [86-105]   BP: (101-139)/(54-67)   Resp:  [16]   SpO2:  [94 %-95 %]   I/O Last 2 Completed Shifts:  In: 750 [Oral:750]  Out: 300 [Urine:300]     PE:  Gen: NAD    Right lower extremity:  Inspection: skin intact  Drain: none  Vascular: palpable dorsalis pedis pulse, toes warm and well perfused, capillary refill < 2s  Motor function: intact DF/PF/EHL  Sensory: S/S/SP/DP/T intact to light touch    Laboratory Tests:  Lab Results   Lab Name Value Date/Time    WBC 6.5 07/09/2020 06:13 PM    HGB 7.8 (L) 07/09/2020 06:13 PM    HCT 22.3 (L) 07/09/2020 06:13 PM    PLT 173 07/09/2020 06:13 PM     Lab Results   Lab Name Value Date/Time    NA 134 (L) 07/09/2020 06:12 PM    K 4.9 07/09/2020 06:12 PM    CL 104 07/09/2020 06:12 PM    CO2 20 (L) 07/09/2020 06:12 PM    BUN 20 07/09/2020 06:12 PM    CR 1.00 07/09/2020 06:12 PM    GLU 166 (H) 07/09/2020 06:12 PM       Assesment and Plan:  Cristina Dacruzis a 89yrld female with [place] B1 periprosthetic femur fracture s/p ORIF on 7/2.    # R periprosthetic femur fracture  - Dressing: none  - Drains: none  - Weight bearing status: WBAT RLE  - Imaging: preop complete  - Cultures: none  - Antibiotics: 24h abx (complete 7/3)    General:  - Pain Management: PO with IV rescue  - PT/OT  - Physical Precautions: none  - Foley: none  - DVT Prophylaxis: LMWH    Dispo: pain control, physical therapy    SaPurvis KiltsMD PGY-1  Pager: [phone] Surgery    Orthopaedic Trauma Contact  List:     Primary/1st Call:   FOR PATIENTS ON D14 AND D11 PLEASE PAGE: NP [phone] (7d/week 7am-6pm)  FOR PATIENTS ON ALL OTHER FLOORS PLEASE PAGE: NP [phone] (7d/week 7am-6pm)  2nd Call: Alvaretta Eisenberger [phone]   3rd Call: [place] [phone]  4th Call: HaLaverta [place]2961  5th Call: BaDenny [phone]-cover: [phone]-cover is primary contact 7d/week 6pm-7am  (This service has an in house resident covering all orthopedic patients. Please limit pages between the hours of 6pm-6am to critical issues)

## 2020-07-10 NOTE — Clinical Case Management (Addendum)
Clinical Case Management Assessments    Name: Cristina Carter  MRN: [id number]   Date of Birth: [birth year] (89yr Gender: female    Note Date: 07/10/2020 Note Time: 16:24       DISCHARGE PLANNING NOTE    [facility]  Takeback Letter: Not Applicable    [place] [address] [place] Discharge Address: same as above    Patient can follow-up with:   PCP: LColbert Coyer PA  / Phone Number: [phone] [phone]    Funding/Billing: Payor: MEDICARE / Plan: MEDICARE PART B ONLY    [facility]   Reason for admission: right periprosthetic hip fracture after GLF.    Patient able to participate in plan?: Yes  Living arrangements: Children  Type of Residence: private residence  Home Environment (floors/stairs): single storey with one small step to enter  Who will be the primary caregiver after discharge?  Phone #? : Cristina Carter(son)- [phone] Pre-Hospitalization self-care deficits: None  Pre-Hospitalization mobility: Independent, Modified Independent  Type of home health care services in place: None  DME in place: None  Patient's goal upon discharge (in patient's own words) : To go home  Does the patient have ongoing DC Planning needs?: TBD  Is the patient ready for discharge today?: No          No post-discharge plans have been finalized at this time.    Comments:   Chart reviewed, Home with [facility] d[facility] seen and noted. [place]     Per MD notes, pt hasn't walked yet because of pain level.    Per [facility] below are the [facility]    [facility]: [phone]    [facility] [phone]         Follow-up/recommendations: Home with Assistance--multiple family members able  to provide 24/7 care.    DME:  Front weel walker    To follow up and cont assist with safe discharge plans.    Date/Time: 07/10/2020 16:24  Electronically Signed by:   MRock Nephew BSN , RN  Clinical Case Manager  Department of Clinical Case Management  Pager #: [phone] Available at [facility] Teams

## 2020-07-10 NOTE — Care Plan (Signed)
Problem: Adult Inpatient Plan of Care  Goal: Plan of Care Review  Outcome: Ongoing, Progressing  Flowsheets (Taken 07/10/2020 0326)  Plan of Care Reviewed With: patient  Outcome Evaluation: VSS on RA, pain managed by Oxy, AOX4. Pain is better managed per patient's report. Martti at bedside for interpretation. Dressing to R hip/leg C/D/I. Patient voids using bedpan. Reposition with LT often but patient slept most of the shift and changes position independently. IVF at 86m/hr. Call light in reach and will continue to monitor.

## 2020-07-10 NOTE — Nurse Assessment (Addendum)
ASSESSMENT NOTE    Note Started: 07/10/2020, 19:13     Initial assessment completed and recorded in EMR.  Report received from day shift nurse and orders reviewed. Plan of Care reviewed and updated and appropriate, discussed with patient.  Isidore Moos, RN RN

## 2020-07-11 ENCOUNTER — Inpatient Hospital Stay (HOSPITAL_COMMUNITY): Payer: Medicare Other

## 2020-07-11 DIAGNOSIS — R0902 Hypoxemia: Secondary | ICD-10-CM

## 2020-07-11 DIAGNOSIS — D62 Acute posthemorrhagic anemia: Secondary | ICD-10-CM

## 2020-07-11 DIAGNOSIS — J9601 Acute respiratory failure with hypoxia: Secondary | ICD-10-CM

## 2020-07-11 LAB — CBC NO DIFFERENTIAL
Hematocrit: 21.2 % — ABNORMAL LOW (ref 36.0–46.0)
Hemoglobin: 7.4 g/dL — ABNORMAL LOW (ref 12.0–16.0)
MCH: 32.6 pg (ref 27.0–33.0)
MCHC: 34.8 % (ref 32.0–36.0)
MCV: 93.9 fL (ref 80.0–100.0)
MPV: 8.6 fL (ref 6.8–10.0)
Platelet Count: 189 10*3/uL (ref 130–400)
RDW: 13.5 % (ref 0.0–14.7)
Red Blood Cell Count: 2.26 10*6/uL — ABNORMAL LOW (ref 4.00–5.20)
White Blood Cell Count: 5.8 10*3/uL (ref 4.5–11.0)

## 2020-07-11 LAB — BLD GAS VENOUS
Base Excess, Ven: 0 mEq/L (ref <=2)
HCO3, Ven: 25 mEq/L (ref 20–28)
O2 Sat, Ven: 85 % (ref 70–100)
PCO2, Ven: 40 mm Hg (ref 35–50)
PO2, Ven: 47 mm Hg (ref 30–55)
pH, VEN: 7.4 (ref 7.30–7.40)

## 2020-07-11 LAB — BASIC METABOLIC PANEL
Calcium: 8.2 mg/dL — ABNORMAL LOW (ref 8.6–10.0)
Carbon Dioxide Total: 20 mmol/L — ABNORMAL LOW (ref 22–29)
Chloride: 106 mmol/L (ref 98–107)
Creatinine Serum: 0.9 mg/dL (ref 0.51–1.17)
E-GFR Creatinine (Female): 60 mL/min/{1.73_m2}
Glucose: 181 mg/dL — ABNORMAL HIGH (ref 74–109)
Potassium: 4.7 mmol/L (ref 3.4–5.1)
Sodium: 136 mmol/L (ref 136–145)
Urea Nitrogen, Blood (BUN): 14 mg/dL (ref 6–20)

## 2020-07-11 LAB — ELECTROCARDIOGRAM WITH RHYTHM STRIP: QTC: 470

## 2020-07-11 LAB — LACTIC ACID, WHOLE BLD VENOUS: Lactic Acid, Whole Bld Venous: 1.3 mmol/L (ref 0.9–1.7)

## 2020-07-11 MED ORDER — FUROSEMIDE 10 MG/ML INJECTION SOLUTION
20.0000 mg | Freq: Once | INTRAMUSCULAR | Status: AC
Start: 2020-07-11 — End: 2020-07-11
  Administered 2020-07-11: 20 mg via INTRAVENOUS
  Filled 2020-07-11: qty 2

## 2020-07-11 MED ORDER — ACETAMINOPHEN 500 MG TABLET
1000.0000 mg | ORAL_TABLET | Freq: Three times a day (TID) | ORAL | Status: DC
Start: 2020-07-11 — End: 2020-07-13
  Administered 2020-07-11 – 2020-07-13 (×7): 1000 mg via ORAL
  Filled 2020-07-11 (×7): qty 2

## 2020-07-11 MED ORDER — CALCIUM 500 MG (AS CARBONATE)-VITAMIN D3 5 MCG (200 UNIT) TABLET
1.0000 | ORAL_TABLET | Freq: Two times a day (BID) | ORAL | Status: DC
Start: 2020-07-11 — End: 2020-07-13
  Administered 2020-07-11 – 2020-07-13 (×5): 1 via ORAL
  Filled 2020-07-11 (×5): qty 1

## 2020-07-11 MED ORDER — ACETAMINOPHEN 325 MG TABLET
650.0000 mg | ORAL_TABLET | ORAL | Status: DC | PRN
Start: 2020-07-11 — End: 2020-07-13

## 2020-07-11 NOTE — Clinical Case Management (Signed)
Clinical Case Management Assessments    Name: Cristina Carter  MRN: [id number]   Date of Birth: [birth year] (27yr Gender: female    Note Date: 07/11/2020 Note Time: 17:11       DISCHARGE PLANNING NOTE    [facility]  Takeback Letter: Not Applicable    Permanent Address:   [address]  [place] [address] Discharge Address:   TBD - SNF vs Home    Patient can follow-up with:   PCP: LColbert Coyer PA  / Phone Number: [phone] [facility] [phone]    Funding/Billing:   Payor: MEDICARE / Plan: MEDICARE PART B ONLY [facility]   Reason for admission: right periprosthetic hip fracture after GLF.    Patient able to participate in plan?: Yes  Living arrangements: Children  Type of Residence: private residence  Home Environment (floors/stairs): single storey with one small step to enter  Who will be the primary caregiver after discharge?  Phone #? : [name](son)- [phone] Pre-Hospitalization self-care deficits: None  Pre-Hospitalization mobility: Independent, Modified Independent  Type of home health care services in place: None  Anticipated home health care services: Home PT, Home OT, Home RN  Anticipated Home Infusion: None  DME in place: walker, 4-wheeled  Anticipated DME: None  Patient's goal upon discharge (in patient's own words) : To go home  Anticipated Disposition: SNF vs Home  Has provider discussed post-acute care options with the patient?: Yes  Does the patient have ongoing DC Planning needs?: Yes  Is the patient ready for discharge today?: No  Does this patient have a planned readmission?: No  Anticipated Barriers to Discharge: pending final DC plan/recommendations     No post-discharge plans have been finalized at this time.    Comments:   Ortho Trauma    Procedures:    7/2 (GS): ORIF R femur for  periprosthetic THA fracture    Plan of care discussed with Interdisciplinary Team in huddle (Dr.Yang, Unit Manager, NP, Charge Nurse, Pharmacist, PT/OT, SW, Health Navigator and DCP), may not clear for discharge today. Noted with desaturations, target O2 sat >92%. Given IV Lasix x 1.    Home Health orders received on 07/04, awaiting confirmation from home health agency. [facility]    '@1530'$  - notified by team that patient did not clear PT. Patient did not progress weel enough for discharge home and may benefit from continued therapy.    PT recommending SNF search (completed). Will await responses and follow up if there is an accepting.    Follow-up/recommendations: DC to SNF vs Home (did not meet goals yet).    Date/Time: 07/11/2020 17:11  Electronically Signed By:  VHilbert Corrigan RN, BSN  Clinical Case Manager for Orthopedics   Department of Clinical Case Management   Available on TigerText   [phone]  Phone [phone] E-Mail: [email or web address]  Regular Schedule: M-F 7:30am to 4:00pm

## 2020-07-11 NOTE — Nurse Assessment (Signed)
ASSESSMENT NOTE    Note Started: 07/11/2020, 10:58     Initial assessment completed and will be recorded in EMR.  Report received from night shift nurse and orders reviewed. Plan of Care reviewed and appropriate, discussed with patient. Pt alert and oriented. Pain well controlled. Pt resting comfortably in bed. This morning, pt spO2 saturation in the mid to high 80s on RA.  Pt denied trouble breathing or SOB. Otherwise, VSS. Pt put on 2L NC and satting in mid 90s. MD notified. Martti interpreter used.  [place],  RN

## 2020-07-11 NOTE — Allied Health Progress (Signed)
PHYSICAL THERAPY PROGRESS NOTE    Patient Name: Cristina Carter  MRN: [id number]   Date of Service: 07/11/2020   Hospital Unit: D14  Time in: 1430  Total time: 30 Minutes  Personal Protective Equipment Utilized: Gloves and Surgical Mask    Was a new therapy order generated with a "Pending Discharge" priority designation?  No. Not applicable--A new PT/OT order was not issued     RN cleared patient to work with PT.      07/11/20 1430   Physical Therapy Time and Intention   Mode of Treatment individual therapy;physical therapy   Total Minutes, Physical Therapy 30   Subjective Pt amenable to participate in PT. C/o R LE pain   Pain   Additional Documentation Pain Scale: Numbers Pre/Post-Treatment (Group)   Pain Assessment   Pretreatment Pain Rating 2/10   Posttreatment Pain Rating 10/10   Pre/Posttreatment Pain Comment R LE   General Information   Existing Precautions/Restrictions fall;weight bearing as tolerated   Weight Bearing as Tolerated RLE   Isolation Precautions protective environment maintained   Observation   General Observations of Patient femal supine in bed with IV acess   Bed Mobility   Scooting, Supine Assistance (Bed Mobility) moderate assist (50-74% patient effort);minimal assist (75-99% patient effort)   Scooting, Edge of Bed Assistance (Bed Mobility) moderate assist (50-74% patient effort);minimal assist (75-99% patient effort)   Supine-Sit Independence (Bed Mobility) minimal assist (75-99% patient effort);moderate assist (50-74% patient effort)   Assistive Device (Bed Mobility) HOB elevated;draw sheet   HOB elevated (in degrees) 30 degrees   Which direction did they exit bed today? right   Comment, (Bed Mobility) modA to minA for LE management   Transfers   Sit-Stand Independence (Transfers) minimal assist (75-99% patient effort);contact guard   Stand-Sit Independence (Transfers) minimal assist (75-99% patient effort);contact guard   Comment, Stand-Sit Transfer needs A to guide hips and control descent    Assistive Device (Transfers) Denna Haggard   Gait Assessment   Comment, Gait n/t   Balance   Static Sitting Balance modified independence   Dynamic Sitting Balance contact guard   Static Standing Balance contact guard   Dynamic Standing Balance unable to assess (comment)   Activity Tolerance   Activity Tolerance fair;poor   Comment, Activity Tolerance limited by pain   Safety Issues, Functional Mobility   Safety Issues Affecting Function (Mobility) friction/shear risk   Impairments Affecting Function (Mobility) balance;endurance/activity tolerance;pain   Progress Summary   PT Assessment Pt progressing gradually in meeting her funcional goals. Pt limited by pain and deconditioning. Pt will benefit from continued PT to improve function and decrease caregiver burden.   Progress Toward Functional Goals progress toward functional goals is gradual;progress toward functional goals is fair   Impairments Still Limiting Function balance impairment;pain;strength deficit       Patient Status after PT treatment:  Pt was positioned for comfort and safety with call light and all needs within reach.  Care transitioned back to RN and discussed patient response to treatment.    CURRENT Discharge Recommendations:        Disposition: Home with Assistance--family or [facility] (SNF) for Rehab        Is Patient Cleared for Disposition above: no - has not met goals yet  Current Level of Assistance or Supervision:  Moderate Assist for ADLs/Mobility  Continued Physical Therapy:  PT at SNF and Home Health PT  Equipment Size: Adult Standard (Pt height 5'3"-6'4")        Equipment:  To Be Determined (TBD)    PT Plan for Next Visit: Continue with current plan of care.    Celesta Aver  Physical Therapist II  Vocera: [phone]   Tiger text available

## 2020-07-11 NOTE — MD Query (Signed)
MEDICAL RECORD INPATIENT CODING UNIT   CLINICAL DOCUMENTATION IMPROVEMENT REQUEST       Patient Name: Massa Kloos Acct:    MRN: [id number] [place] Date: 07/06/2020   Physician: Marilynne Drivers, MD         Additional documentation/clarification is needed to ensure that the severity of illness, risk of mortality, and DRG are accurate for your patient's encounter.     The following clinical information needs clarification. Please see my question(s) below. To respond, addend and sign this query. Your addendum and signature will be filed as a permanent note in the patient's legal medical record.      The medical record reflects the following clinical findings, treatment, and risk factors.    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 Acute blood loss anemia  2. Other explanation or diagnosis supported by the clinical findings (please provide)  3. Unable to determine a diagnosis (no explanation for clinical findings)  4. None of the above / Not applicable    Please provide your answer below by clicking the addend button    ANSWER:     Acute blood loss anemia      Thank you,     Earney Hamburg, RN, BSN, CCDS, CDIP  Health Information Management (HIM) Division  Patient Financial Services  Clinical Documentation Specialist  [phone]

## 2020-07-11 NOTE — Progress Notes (Signed)
Orthopaedic Trauma Progress Note  Date: 07/11/2020  Unit: [id number]  Attending of Record: Attending Provider: Quentin Angst, MD    ID:  Alaiyah Nedrow is a 80yrold female with right periprosthetic hip fracture after GLF.      Procedures:    7/2 (GS): ORIF R femur for periprosthetic THA fracture    Interval Events/Subjective:  Pt continues to report bothersome pain. No other complaints this AM and no acute events overnight.   Objective:  Temp src: Oral (07/05 1930)  Temp:  [36.5 C (97.7 F)-37.6 C (99.6 F)]   Pulse:  [87-98]   BP: (101-137)/(58-69)   Resp:  [16]   SpO2:  [87 %-97 %]   I/O Last 2 Completed Shifts:  In: 655[Oral:690]  Out: 650 [Urine:650]     PE:  Gen: NAD    Right lower extremity:  Inspection: skin intact  Drain: none  Vascular: palpable dorsalis pedis pulse, toes warm and well perfused, capillary refill < 2s  Motor function: intact DF/PF/EHL  Sensory: S/S/SP/DP/T intact to light touch    Laboratory Tests:  Lab Results   Lab Name Value Date/Time    WBC 5.8 07/11/2020 10:41 AM    HGB 7.4 (L) 07/11/2020 10:41 AM    HCT 21.2 (L) 07/11/2020 10:41 AM    PLT 189 07/11/2020 10:41 AM     Lab Results   Lab Name Value Date/Time    NA 136 07/11/2020 10:41 AM    K 4.7 07/11/2020 10:41 AM    CL 106 07/11/2020 10:41 AM    CO2 20 (L) 07/11/2020 10:41 AM    BUN 14 07/11/2020 10:41 AM    CR 0.90 07/11/2020 10:41 AM    GLU 181 (H) 07/11/2020 10:41 AM       Assesment and Plan:  MYakira Hannumis a 89yrld female with [place] B1 periprosthetic femur fracture s/p ORIF on 7/2.    # R periprosthetic femur fracture  - Dressing: none  - Drains: none  - Weight bearing status: WBAT RLE  - Imaging: preop complete  - Cultures: none  - Antibiotics: 24h abx (complete 7/3)    General:  - Pain Management: PO with IV rescue  - PT/OT  - Physical Precautions: none  - Foley: none  - DVT Prophylaxis: LMWH    Dispo: pain control, physical therapy    SaPurvis KiltsMD PGY-1  Pager: [phone]  Surgery    Orthopaedic Trauma Contact List:     Primary/1st Call:   FOR PATIENTS ON D14 AND D11 PLEASE PAGE: NP [phone] (7d/week 7am-6pm)  FOR PATIENTS ON ALL OTHER FLOORS PLEASE PAGE: NP x [phone] (7d/week 7am-6pm)  2nd Call: Madilynn Montante [phone]   3rd Call: McKeithan [phone]  4th Call: HaLaverta Baltimore2961  5th Call: BaDenny [phone]-cover: [phone]-cover is primary contact 7d/week 6pm-7am  (This service has an in house resident covering all orthopedic patients. Please limit pages between the hours of 6pm-6am to critical issues)    SaPurvis KiltsMD PGY-1  Pager: [phone] Surgery

## 2020-07-11 NOTE — Nurse Assessment (Deleted)
ASSESSMENT NOTE    Note Started: 07/11/2020, 19:12     Initial assessment completed and recorded in EMR.  Report received from night shift nurse and orders reviewed. Plan of Care reviewed and updated and appropriate, discussed with patient.  Isidore Moos, RN RN

## 2020-07-11 NOTE — Care Plan (Signed)
Problem: Adult Inpatient Plan of Care  Goal: Plan of Care Review  Outcome: Ongoing, Progressing  Flowsheets (Taken 07/11/2020 0313)  Plan of Care Reviewed With: patient  Outcome Evaluation: VSS on RA, AOX4, pain managed by Oxy '5mg'$ . Can help with turns, uses bedpan to void. Martti for interpretation. Bowel regimen conitnued, no BM this shift. NaCl at 10m/hr. Call light in reach and will continue to monitor.

## 2020-07-11 NOTE — Care Plan (Signed)
Problem: Adult Inpatient Plan of Care  Goal: Plan of Care Review  Outcome: Ongoing, Progressing  Flowsheets (Taken 07/11/2020 1652)  Plan of Care Reviewed With: patient  Progress: improving  Outcome Evaluation: Pt AOx3. VSS. Pain managed on current regimen. Pt can help with turns using overhead trapeze. External cath in place, pt reports having incontinence. No BM, bowel regimen continued. Good PO intake. Pt up in cardiac chair with PT. Pt educated on and encouraged to use IS. Martti interpreter used. Frequent rounding done. Safety precautions maintained.

## 2020-07-11 NOTE — Progress Notes (Signed)
ORTHOPEDIC CO-MANAGEMENT DAILY PROGRESS NOTE    Date: 07/11/2020 Time: 12:42    HANDOFF SUMMARY  80yrold female patient with history of HTN presenting with R hip fracture.  Edited by: SLorn Junes MD at 07/07/2020 [phone]   ------------------------------------------------------------------------------------------------------------------  ASSESSMENT AND PLAN  89yrld female patient with history of HTN presenting with R hip fracture.  Edited by: SrLorn JunesMD at 07/07/2020 [phone]  Summary of Recommendations:  1. Acute hypoxic RF. Unclear etiology, lungs with some faint bibasilar crackles. Patient with very poor lung volumes on incentive spirometry, suspect likely atelectasis. Continue aggressive IS. CXR obtained and is unremarkable. No edema noted. Was given 1 dose of lasix '20mg'$  IV. IVF discontinued  2. Acute blood loss anemia, 2/2 surgery. Transfuse if Hgb <7.     # Acute hypoxic RF 2/2 atelectasis?  CXR without signs of atelectasis, pulm edema or infiltrates. Lasix '20mg'$  IV x 1 given.   - aggressive incentive spirometry  - Mobilize as tolerated  - wean O2 to target O2 sat >92%    #R Periprosthetic hip fracture  -Defer to ortho for management    # GERD  - continue famotidine Q12H PRN  - could consider starting PPI vs defer to outpatient setting    #Elevated lipase  No clinical correlation-- no abd pain, no imaging abnormalities.   - No further work up; monitor    #Osteoporosis  #Fragility fracture  #Pathologic Fracture  PTH wnl, vit D, 25-OH wnl, phos WNL, [place] wnl.   - vitamin D/Calcium supplement 1 tab BID ordered              -Physical therapy outpatient (SNF vs. HH vs. Outpatient)             -Include patient education with dot phrase .FRAGILITYFXDC in patient discharge instructions  -PCP should order baseline DEXA scan outpatient if not completed in the past year  -PCP should order bone health labs (Ca, Vit D) 3 months after discharge  - Based on Vit D level, will order supplementation;  level normal               #HTN  - Continue amlodipine and losartan    #Acute blood loss anemia, normocytic, 2/2 surgery  -Monitor post op, transfuse if Hgb <7    FEN: SPECIAL TRAY REQUEST  MECHANICAL SOFT DIET (INTACT OR CHOPPED)  Last BM: Last Bowel Movement: 07/10/20  DVT Prophylaxis: VTE prophylaxis contraindicated 2/2 surgery  Disposition: pending PT/OT eval    ------------------------------------------------------------------------------------------------------------------    SUBJECTIVE  Patient seen with spanish interpreter via [place]. Feels fine.Denies any SOB/cough/chest pain, abdo pain.     REVIEW OF SYSTEMS  Constitutional: negative.  Eyes: negative.  Ears, Nose, Mouth, Throat: negative.  CV: negative.  Resp: negative.  GI: negative.  GU: negative.  Musculoskeletal: negative.  Integumentary: negative.  Neuro: negative.  Psych: Mood pt's report, euthymic.     Medications  Acetaminophen (TYLENOL) Tablet 1,000 mg, ORAL, Q8H  Amlodipine (NORVASC) Tablet 5 mg, ORAL, QAM  Atorvastatin (LIPITOR) Tablet 10 mg, ORAL, Daily Bedtime  Docusate (COLACE) Capsule 100 mg, ORAL, BID  Enoxaparin (LOVENOX) Injection 30 mg, SUBCUTANEOUS, Q12H  Losartan (COZAAR) Tablet 100 mg, ORAL, QAM  Polyethylene Glycol 3350 (MIRALAX) Oral Powder Packet 17 g, ORAL, QAM  Sennosides (SENOKOT) Tablet 17.2 mg, ORAL, Daily Bedtime        Acetaminophen (TYLENOL) Tablet 650 mg, ORAL, Q4H PRN  FamoTIDine (PEPCID) Tablet 20 mg, ORAL, Q12H PRN  Oxycodone (ROXICODONE) Tablet 5 mg, ORAL, RESCUE Q4H PRN   Or  Hydromorphone (DILAUDID) Injection 0.2 mg, IV, RESCUE Q2H PRN  Magnesium Hydroxide (MILK OF MAGNESIA) 400 mg/5 mL Suspension 30 mL, ORAL, Q12H PRN  Naloxone (NARCAN) Injection 0.1 mg, IV, Q2MIN PRN  Oxycodone (ROXICODONE) Tablet 5 mg, ORAL, Q4H PRN   Or  Oxycodone (ROXICODONE) Tablet 10 mg, ORAL, Q4H PRN        OBJECTIVE    Vitals Signs  Current Vitals  Temp: 37.6 C (99.6 F)  BP: 102/65  Pulse: 90  Resp: 16  SpO2: 95 %  Flow  (L/min): 1  Weight: 73.6 kg (162 lb 4.1 oz)    Intake and Output  Last Two Completed Shifts  In: 1206.2 [Oral:610; Crystalloid:596.2]  Out: 325 [Urine:325]    Current Shift  In: 360 [Oral:360]  Out: -     PHYSICAL EXAM   Gen: Elderly woman appears stated age, in no acute distress  HEENT: EOMI, anicteric sclera, NCAT  Neck: supple  CV: systolic murmur heard at RUSB, no other rubs or gallops, regular rhythm  Lungs:faint bibasilar crackles.   Abdo: +BS, soft, mildly TTP in the epigastrium but without rebound or guarding  Ext: trace edema of R leg (surgical leg). No edema left leg.     Lines and Drains     Incision/Wound/Pressure Injury 07/08/20 Incision Leg Right (Active)       Peripheral IV 07/08/20 Anterior;Right Forearm (Active)        Labs and Studies  I have reviewed the following information from the last 24 hours: allied health and treating physician notes, imaging and labs and microbiology data    Please see top of the note for assessment and plan    Approximately 15 minutes were spent face to face with the patient, of which more than 50% was spent in counseling and/or coordination of care on 07/10/20. The patient understands and agrees with the plan of care as outlined       Report Electronically Signed by:   Caswell Corwin, MD   Associate Physician  [facility]  Pager: [phone]  PI: [id number]    Date: 07/11/2020 Time: 12:42

## 2020-07-11 NOTE — Nurse Assessment (Addendum)
ASSESSMENT NOTE    Note Started: 07/11/2020, 19:13     Initial assessment completed and recorded in EMR.  Report received from day shift nurse and orders reviewed. Plan of Care reviewed and updated and appropriate, discussed with patient.  Isidore Moos, RN RN

## 2020-07-12 DIAGNOSIS — R918 Other nonspecific abnormal finding of lung field: Secondary | ICD-10-CM

## 2020-07-12 LAB — CBC NO DIFFERENTIAL
Hematocrit: 20.9 % — ABNORMAL LOW (ref 36.0–46.0)
Hemoglobin: 7.2 g/dL — ABNORMAL LOW (ref 12.0–16.0)
MCH: 32.5 pg (ref 27.0–33.0)
MCHC: 34.5 % (ref 32.0–36.0)
MCV: 94.1 fL (ref 80.0–100.0)
MPV: 8.6 fL (ref 6.8–10.0)
Platelet Count: 207 10*3/uL (ref 130–400)
RDW: 13 % (ref 0.0–14.7)
Red Blood Cell Count: 2.22 10*6/uL — ABNORMAL LOW (ref 4.00–5.20)
White Blood Cell Count: 5.7 10*3/uL (ref 4.5–11.0)

## 2020-07-12 NOTE — Nurse Assessment (Signed)
ASSESSMENT NOTE    Note Started: 07/12/2020, 19:37     Initial assessment completed and recorded in EMR.  Report received from day shift nurse and orders reviewed. Pt AOX4, VSS and afebrile. C/o Right hip pain 6/10 - PRN oxycodone and scheduled Tylenol given per order. Pt denies CP, SOB or labored breathing. HRR and lung sounds clear on ausculation bilaterally. Surgical site on the right hip CDI, adhesive island wound dressing in place. Pt resting in bed. The call light within reach, bed alarmed and locked in low position. Bed pan within reach when requested. Plan of Care reviewed and updated, discussed with patient.  Mendel Ryder,  RN

## 2020-07-12 NOTE — Care Plan (Signed)
Respiratory Care Evaluation - Pulmonary Hygiene Pathway Daily Evaluation  Name: Cristina Carter Admit Date:   07/06/2020  7:04 PM Date of Birth:   10-29-1940      Age: 80 yrs Sex: female        Chief Complaint:       Chief Complaint   Patient presents with    *922:Blunt/Critical Trauma Level II         Date of Initial Respiratory Care Evaluation Order: 07/08/2020     Progress Scoring Tool  *Score > 18 indicates readiness to discontinue post-operative chest physiotherapy     Mobility: unable to achieve preoperative mobility status = 0  Breath sounds: slightly decreased breath sounds or presence of minimal adventitia = 2  Secretion clearance: able to clear secretions independently/return = 3  SpO2 at rest and during activity (assessed on baseline/pre-op FiO2): SpO2 >92% (w/o pulmonary hx) or SpO2 >88% (with pulmonary hx) = 3  RR at rest and during activity: within acceptable range for this individual = 3  CXRAY: XRAY WDL for this individual/no Cxray/imaging for 3 days = 2  Incentive spirometry: > 10 mls/kg IBW = 2  Pleural chest tubes: no chest tubes remain = 2     Progress Score: 17     Treatment Plan by RT:   Risk Level: Moderate Risk - Positive pressure therapy; BID for atelectasis, or Acapella Therapy BID for atelectasis and mucokinesis  Modality: PAP  Mouthpiece  Frequency: BID     Treatment Plan by RN:  4x/day deep breathing, I.S., cough/splint, ambulate/chair per pathway.      If patient's condition changes please reorder Respiratory Care Evaluation.    Marguarite Arbour RRT

## 2020-07-12 NOTE — Hospital Course (Addendum)
#   Dysuria  # Suprapubic abdo pain  Pending UA     # Acute hypoxic RF 2/2 atelectasis, resolving  CXR without signs of atelectasis, pulm edema or infiltrates. Lasix '20mg'$  IV x 1 given. IVF discontinued. Crackles still present at LLL, but otherwise CTA. Was stable on room air on 7/6.   - aggressive incentive spirometry  - Mobilize as tolerated     #Acute blood loss anemia, normocytic, 2/2 surgery  Hgb downtrending, likely related to blood loss during surgery.   -Monitor post op, transfuse if Hgb <7     #R Periprosthetic hip fracture  -Defer to ortho for management     # GERD  - continue famotidine Q12H PRN  - could consider starting PPI vs defer to outpatient setting     #Elevated lipase  No clinical correlation-- no abd pain, no imaging abnormalities.   - No further work up; monitor     #Osteoporosis  #Fragility fracture  #Pathologic Fracture  PTH wnl, vit D, 25-OH wnl, phos WNL, [place] wnl.   - vitamin D/Calcium supplement 1 tab BID ordered              -Physical therapy outpatient (SNF vs. HH vs. Outpatient)             -Include patient education with dot phrase .FRAGILITYFXDC in patient discharge instructions  -PCP should order baseline DEXA scan outpatient if not completed in the past year  -PCP should order bone health labs (Ca, Vit D) 3 months after discharge  - Based on Vit D level, will order supplementation; level normal               #HTN   - Continue amlodipine and losartan

## 2020-07-12 NOTE — Discharge Planning (AHS/AVS) (Addendum)
Your Doctor has ordered [place] for you. Your Home Health services will be provided by:    [facility]  Phone: [phone]     Your home health agency will be contacting you within 24-48 hours for scheduling. If you have any questions regarding your discharge planning arrangement please call the Clinical Case Management Department at [phone] Monday-Friday 8:00 a.m. to 5:00 p.m. for assistance.    Your Doctor has ordered DME Durable Medical Equipment for you.    [employer]: [phone] will provide the wheelchair for you.    Please contact the listed provider for any issues or concerns with the equipment you receive.     If you have any questions or concerns regarding your discharge arrangements, please call the Clinical Case Management Department [phone] Monday-Friday 8:00a.m. to 5:00 p.m. for assistance.

## 2020-07-12 NOTE — Allied Health Progress (Signed)
PHYSICAL THERAPY PROGRESS NOTE    Patient Name: Cristina Carter  MRN: [id number]   Date of Service: 07/12/2020   Hospital Unit: D14  Time in: 1010  Total time: 25 Minutes  Personal Protective Equipment Utilized: Gloves and Surgical Mask    Was a new therapy order generated with a "Pending Discharge" priority designation?  No. Not applicable--A new PT/OT order was not issued     RN cleared patient to work with PT.      07/12/20 1010   Physical Therapy Time and Intention   Mode of Treatment individual therapy;physical therapy   Total Minutes, Physical Therapy 25   Subjective Pt amenable to participate in PT. C/o R LE pain   Pain Assessment   Pretreatment Pain Rating 3/10   Posttreatment Pain Rating 8/10   Pre/Posttreatment Pain Comment R LE   General Information   Existing Precautions/Restrictions fall;weight bearing as tolerated   Weight Bearing as Tolerated RLE   Isolation Precautions protective environment maintained   Observation   General Observations of Patient femal supine in bed with IV acess   Bed Mobility   Supine-Sit Independence (Bed Mobility) minimal assist (75-99% patient effort)   Sit-Supine Independence (Bed Mobility) minimal assist (75-99% patient effort)   Assistive Device (Bed Mobility) bed rails;draw sheet   HOB elevated (in degrees) 30 degrees   Which direction did they exit bed today? right   Comment, (Bed Mobility) minA for LE management   Transfers   Sit-Stand Independence (Transfers) minimal assist (75-99% patient effort);contact guard;verbal cues   Comment, Sit-Stand Transfer needs cues for hand placement   Stand-Sit Independence (Transfers) minimal assist (75-99% patient effort)   Comment, Stand-Sit Transfer needs A to guide hips and control descent   Assistive Device Size (Transfer) Youth (Pt height 4'7"- 5'3")   Assistive Device (Transfers) walker, front-wheeled   Gait Assessment   Independence Level (Gait) minimal assist (75-99% patient effort)   Assistive Device Size (Gait) Youth (Pt height  4'7"- 5'3")   Assistive Device (Gait) walker, front-wheeled   Distance in Feet (Gait) 4 steps x 3   Pattern (Gait) step-to   Deviations/Abnormal Patterns (Gait) right sided deviations   Right Sided Gait Deviations antalgic;inconsistent step length;poor foot clearance;short step length   Balance   Static Sitting Balance modified independence   Dynamic Sitting Balance contact guard   Static Standing Balance contact guard   Dynamic Standing Balance minimal assist (75-99% patient effort)   Comment, Standing Balance used FWW   Therapeutic Interventions   Comment, Therapeutic Exercise heel slide   Activity Tolerance   Activity Tolerance fair   Comment, Activity Tolerance limited by pain   Safety Issues, Functional Mobility   Safety Issues Affecting Function (Mobility) friction/shear risk   Impairments Affecting Function (Mobility) balance   Progress Summary   PT Assessment Pt progressing gradually in meeting goals. Pt able to tolerate pre-gait activities (side stepping, forward and backward stepping) at [place] to [place]. Max vc for sequence and technique.Pt's daughter to return tomorrow for family training (~10AM).   Progress Toward Functional Goals progress toward functional goals is gradual   Impairments Still Limiting Function balance impairment;functional activity tolerance impairment;strength deficit       Patient Status after PT treatment:  Pt was positioned for comfort and safety with call light and all needs within reach.  Care transitioned back to RN and discussed patient response to treatment.    CURRENT Discharge Recommendations:        Disposition: Home with Assistance--daughter or Skilled  Nursing Facility (SNF) for Rehab        Is Patient Cleared for Disposition above: no - has not met goals yet.  Current Level of Assistance or Supervision:  Minimal Assist for ADLs/Mobility and Moderate Assist for ADLs/Mobility  Continued Physical Therapy:  PT at SNF and Home Health PT                            Equipment Size:  Youth (Pt height 4'7"-5'3")        Equipment:  FWW: Front wheel walker    PT Plan for Next Visit: Continue with current plan of care.    Celesta Aver  Physical Therapist II  Vocera: [phone]   Tiger text available

## 2020-07-12 NOTE — Progress Notes (Signed)
Orthopaedic Trauma Progress Note  Date: 07/12/2020  Unit: [id number]  Attending of Record: Attending Provider: Quentin Angst, MD    ID:  Cristina Carter is a 80yrold female with right periprosthetic hip fracture after GLF.      Procedures:    7/2 (GS): ORIF R femur for periprostheticTHA fracture    Interval Events/Subjective:  Pt continues to report bothersome pain. No other complaints this AM and no acute events overnight.     Objective:  Temp src: Oral (07/06 1936)  Temp:  [36.3 C (97.3 F)-37.3 C (99.1 F)]   Pulse:  [72-89]   BP: (104-132)/(59-65)   Resp:  [15-18]   SpO2:  [95 %-97 %]   I/O Last 2 Completed Shifts:  In: 360 [Oral:360]  Out: 950 [Urine:950]     PE:  Gen: NAD    Right lower extremity:  Inspection: dressing with serosanguinous drainage as pictured in chart  Drain: none  Vascular: palpable dorsalis pedis pulse, toes warm and well perfused, capillary refill < 2s  Motor function: intact DF/PF/EHL  Sensory: S/S/SP/DP/T intact to light touch    Laboratory Tests:  Lab Results   Lab Name Value Date/Time    WBC 5.7 07/12/2020 06:07 AM    HGB 7.2 (L) 07/12/2020 06:07 AM    HCT 20.9 (L) 07/12/2020 06:07 AM    PLT 207 07/12/2020 06:07 AM     Lab Results   Lab Name Value Date/Time    NA 136 07/11/2020 10:41 AM    K 4.7 07/11/2020 10:41 AM    CL 106 07/11/2020 10:41 AM    CO2 20 (L) 07/11/2020 10:41 AM    BUN 14 07/11/2020 10:41 AM    CR 0.90 07/11/2020 10:41 AM    GLU 181 (H) 07/11/2020 10:41 AM       Assesment and Plan:  MNourah Caulkinsis a [age]yrld female with [place] B1 periprosthetic femur fracture s/p ORIF on 7/2.    # R periprosthetic femur fracture  - Dressing: change so no longer saturated  - Drains: none  - Weight bearing status: WBAT RLE  - Imaging: preop complete  - Cultures: none  - Antibiotics: 24h abx (complete 7/3)    General:  - Pain Management: PO with IV rescue  - PT/OT  - Physical Precautions: none  - Foley: none  - DVT Prophylaxis: LMWH    Dispo: pain control, physical  therapy    SaPurvis KiltsMD PGY-1  Pager: [phone] [facility] PLL[facility]opaedic Surgery    Orthopaedic Trauma Contact List:     Primary/1st Call:   FOR PATIENTS ON D14 AND D11 PLEASE PAGE: NP [phone] (7d/week 7am-6pm)  FOR PATIENTS ON ALL OTHER FLOORS PLEASE PAGE: NP [phone] (7d/week 7am-6pm)  2nd Call: Cristina Carter [phone]   3rd Call: Cristina Carter [phone]  4th Call: HaLaverta [place]2961  5th Call: BaDenny [phone]-cover: [phone]-cover is primary contact 7d/week 6pm-7am  (This service has an in house resident covering all orthopedic patients. Please limit pages between the hours of 6pm-6am to critical issues)    SaPurvis KiltsMD PGY-1  Pager: [phone] Surgery

## 2020-07-12 NOTE — Progress Notes (Signed)
ORTHOPEDIC CO-MANAGEMENT DAILY PROGRESS NOTE    Date: 07/12/2020 Time: 11:47    HANDOFF SUMMARY  80yrold female patient with history of HTN presenting with R hip fracture.  Edited by: SLorn Junes MD at 07/07/2020 [phone]   ------------------------------------------------------------------------------------------------------------------  ASSESSMENT AND PLAN  89yrld female patient with history of HTN presenting with R hip fracture.  Edited by: SrLorn JunesMD at 07/07/2020 [phone]  Summary of Recommendations:  1. Acute hypoxic RF appears to have resolved. On room air this AM. Continue aggressive IS.   2. Acute blood loss anemia, 2/2 surgery. Transfuse if Hgb <7.     # Acute hypoxic RF 2/2 atelectasis, resolving  CXR without signs of atelectasis, pulm edema or infiltrates. Lasix '20mg'$  IV x 1 given. IVF discontinued. Crackles still present at LLL, but otherwise CTA.   - aggressive incentive spirometry  - Mobilize as tolerated  - wean O2 to target O2 sat >92%    #Acute blood loss anemia, normocytic, 2/2 surgery  -Monitor post op, transfuse if Hgb <7    #R Periprosthetic hip fracture  -Defer to ortho for management    # GERD  - continue famotidine Q12H PRN  - could consider starting PPI vs defer to outpatient setting    #Elevated lipase  No clinical correlation-- no abd pain, no imaging abnormalities.   - No further work up; monitor    #Osteoporosis  #Fragility fracture  #Pathologic Fracture  PTH wnl, vit D, 25-OH wnl, phos WNL, [place] wnl.   - vitamin D/Calcium supplement 1 tab BID ordered              -Physical therapy outpatient (SNF vs. HH vs. Outpatient)             -Include patient education with dot phrase .FRAGILITYFXDC in patient discharge instructions  -PCP should order baseline DEXA scan outpatient if not completed in the past year  -PCP should order bone health labs (Ca, Vit D) 3 months after discharge  - Based on Vit D level, will order supplementation; level normal               #HTN  -  Continue amlodipine and losartan    FEN: SPECIAL TRAY REQUEST  MECHANICAL SOFT DIET (INTACT OR CHOPPED)  Last BM: Last Bowel Movement: 07/12/20  DVT Prophylaxis: VTE prophylaxis contraindicated 2/2 surgery  Disposition: pending PT/OT eval    ------------------------------------------------------------------------------------------------------------------    SUBJECTIVE  Patient seen with spanish interpreter via [employer]. Feels fine.Denies any SOB/cough/chest pain, abdo pain.     REVIEW OF SYSTEMS  Constitutional: negative.  Eyes: negative.  Ears, Nose, Mouth, Throat: negative.  CV: negative.  Resp: negative.  GI: negative.  GU: negative.  Musculoskeletal: negative.  Integumentary: negative.  Neuro: negative.  Psych: Mood pt's report, euthymic.     Medications  Acetaminophen (TYLENOL) Tablet 1,000 mg, ORAL, Q8H  Amlodipine (NORVASC) Tablet 5 mg, ORAL, QAM  Atorvastatin (LIPITOR) Tablet 10 mg, ORAL, Daily Bedtime  Calcium-Cholecalciferol (D3) (OSCAL 500+D) Tablet 1 tablet, ORAL, BID  Docusate (COLACE) Capsule 100 mg, ORAL, BID  Enoxaparin (LOVENOX) Injection 30 mg, SUBCUTANEOUS, Q12H  Losartan (COZAAR) Tablet 100 mg, ORAL, QAM  Polyethylene Glycol 3350 (MIRALAX) Oral Powder Packet 17 g, ORAL, QAM  Sennosides (SENOKOT) Tablet 17.2 mg, ORAL, Daily Bedtime        Acetaminophen (TYLENOL) Tablet 650 mg, ORAL, Q4H PRN  FamoTIDine (PEPCID) Tablet 20 mg, ORAL, Q12H PRN  Oxycodone (ROXICODONE) Tablet 5 mg, ORAL,  RESCUE Q4H PRN   Or  Hydromorphone (DILAUDID) Injection 0.2 mg, IV, RESCUE Q2H PRN  Magnesium Hydroxide (MILK OF MAGNESIA) 400 mg/5 mL Suspension 30 mL, ORAL, Q12H PRN  Naloxone (NARCAN) Injection 0.1 mg, IV, Q2MIN PRN  Oxycodone (ROXICODONE) Tablet 5 mg, ORAL, Q4H PRN   Or  Oxycodone (ROXICODONE) Tablet 10 mg, ORAL, Q4H PRN        OBJECTIVE    Vitals Signs  Current Vitals  Temp: 36.4 C (97.5 F)  BP: 127/65  Pulse: 77  Resp: 16  SpO2: 96 %  Flow (L/min): 1  Weight: 73.6 kg (162 lb 4.1 oz)    Intake and  Output  Last Two Completed Shifts  In: 78 [Oral:680]  Out: 1150 [Urine:1150]    Current Shift  In: -   Out: 250 [Urine:250]    PHYSICAL EXAM   Gen: Elderly woman appears stated age, in no acute distress  HEENT: EOMI, anicteric sclera, NCAT  Neck: supple  CV: systolic murmur heard at RUSB, no other rubs or gallops, regular rhythm  Lungs:crackles present in LLL  Abdo: +BS, soft, mildly TTP in the epigastrium but without rebound or guarding  Ext: trace edema of R leg (surgical leg). No edema left leg.     Lines and Drains     Incision/Wound/Pressure Injury 07/08/20 Incision Leg Right (Active)       Peripheral IV 07/08/20 Anterior;Right Forearm (Active)        Labs and Studies  I have reviewed the following information from the last 24 hours: allied health and treating physician notes, imaging and labs and microbiology data    Please see top of the note for assessment and plan    Approximately 15 minutes were spent face to face with the patient, of which more than 50% was spent in counseling and/or coordination of care on 07/10/20. The patient understands and agrees with the plan of care as outlined       Report Electronically Signed by:   Caswell Corwin, MD   Associate Physician  [facility]  Pager: [phone]  PI: [phone]    Date: 07/12/2020 Time: 11:47

## 2020-07-12 NOTE — Care Plan (Signed)
Problem: Adult Inpatient Plan of Care  Goal: Plan of Care Review  Outcome: Ongoing, Progressing  Flowsheets (Taken 07/12/2020 0308)  Plan of Care Reviewed With: patient  Outcome Evaluation: VSS on RA, AOX4, pain managed by PRN oxy and scheduled tylenol. External cath in place and can use the overhead trapeze. No BM, bowel care med continued. Patient used IS. SIRS fired, lactic drawn resulted 1.3. Martti used. Call light in reach and will continue to monitor.

## 2020-07-12 NOTE — Allied Health Progress (Signed)
PM&R--ACUTE CARE SERVICE  OCCUPATIONAL THERAPY TREATMENT NOTE [UNIT:D14]    Treatment Day 2 of 10 in reporting period (medicare only)  Personal Protective Equipment Utilized: Gloves and Surgical Mask    ----------------------------------------------------------------------------------------------------------------------------------------------------------------------------  CURRENT Discharge Recommendations:         Current Functional Level: Extensive assistance        Equipment Needs Upon Discharge: Commode Chair, Hip Kit, and Front Wheeled Walker        Anticipated Discharge Disposition: Home with Assist, Home with Home Health OT, or Home with Home Health PT   ----------------------------------------------------------------------------------------------------------------------------------------------------------------------------       07/12/20 1112   Time and Intention   Document Type therapy progress note (daily note)   Mode of Treatment individual therapy   Total Minutes, Occupational Therapy 21   Pain   Additional Documentation Pain Scale: Numbers Pre/Post-Treatment (Group)   Comment, Pain Pain at EOB increased. Patient declined to stand d/t pain.   Pain Assessment   Pretreatment Pain Rating 8/10   Posttreatment Pain Rating 8/10   Pre/Posttreatment Pain Comment R LE   Subjective   Subjective RN cleared for treatment. Patient was medicated for pain. Martii used for interpretive services.   ADL Re-training   ADL Intervention Lower Body Dressing  (training in use of sock aid and reacher for dressing.)   Independence Level (Lower Body Dressing) minimal assist (75-99% patient effort)   Assistive Devices (Lower Body Dressing) sock-aid;reacher   Position (Lower Body Dressing) unsupported sitting;edge of bed sitting   Lower Body Dressing Treatment/Intervention adaptive equipment training;BADL process/adaptation training   Bed Mobility   Supine-Sit Independence (Bed Mobility) minimal assist (75-99% patient effort)    Sit-Supine Independence (Bed Mobility) minimal assist (75-99% patient effort)   Assistive Device (Bed Mobility) HOB elevated  (trapeze used)   HOB elevated (in degrees) 30 degrees   Which direction did they exit bed today? right   Which direction does the pt normally exit bed? right   Comment, (Bed Mobility) Min A for lifting and positioning legs   Functional Mobility   Sit-Stand Independence (Transfers) unable to assess   Stand-Sit Independence (Transfers) unable to assess   Therapeutic Activity   Therapeutic Activity Reaching;Sitting   Progress Summary (OT)   Current Functional Level Extensive assistance   Therapy Plan Review/Discharge Plan (OT)   Planned Therapy Interventions (OT) activity tolerance training;adaptive equipment training;BADL retraining   Comment, Plan of Care Patient would benefit from continued OT. Continue with POC.   Therapy Plan Review (OT) care plan/treatment goals reviewed;participants voiced agreement with care plan;participants included;patient;daughter   Equipment Needs Upon Discharge (OT) commode chair;walker, front-wheeled;hip kit;gait belt   Patient/Family Concerns, Equipment Needs at Discharge (OT) Family member commented during AE training that she could help instead of patient using devices.   Anticipated Discharge Disposition (OT) home with assist;home with home health OT;home with home health PT   Discharge Summary (OT)   Discharge Summary Statement (OT) if patient discharged prior to next treatment the following summary above reflects current progress and further recommendations       *Pt transitioned back to the care of the RN; Call Button at Reach*    Was a new therapy order generated with a "Pending Discharge" priority designation?  No. Not applicable--A new PT/OT order was not issued      Electronically Signed by:  Joshua Hutchison COTA/L I  Department of Physical Medicine and Rehabilitation  PI# [id number]  Vocera [phone]

## 2020-07-12 NOTE — Care Plan (Signed)
Problem: Adult Inpatient Plan of Care  Goal: Plan of Care Review  Outcome: Ongoing, Progressing  Flowsheets (Taken 07/12/2020 1657)  Plan of Care Reviewed With: patient  Outcome Evaluation: Pt AOx4. VSS on RA. Pain managed on current regimen. Pt can help with turns using overhead trapeze. Pt voids using bedpan, one large loose BM. Pt encouraged to use IS. Martti interpreter used. Frequent rounding done. Safety precautions maintained.

## 2020-07-12 NOTE — Nurse Assessment (Signed)
ASSESSMENT NOTE    Note Started: 07/12/2020, 10:18     Initial assessment completed and will be recorded in EMR.  Report received from night shift nurse and orders reviewed. Plan of Care reviewed and appropriate, discussed with patient. Pt alert and oriented. Pain well controlled. Pt resting comfortably in bed. Denies SOB. Martti interpreter used.      Fredonia Highland,  RN

## 2020-07-13 ENCOUNTER — Other Ambulatory Visit: Payer: Self-pay

## 2020-07-13 DIAGNOSIS — R339 Retention of urine, unspecified: Secondary | ICD-10-CM

## 2020-07-13 DIAGNOSIS — R102 Pelvic and perineal pain: Secondary | ICD-10-CM

## 2020-07-13 DIAGNOSIS — R350 Frequency of micturition: Secondary | ICD-10-CM

## 2020-07-13 DIAGNOSIS — R300 Dysuria: Secondary | ICD-10-CM

## 2020-07-13 LAB — URINALYSIS AND CULTURE IF IND
Bilirubin Urine: NEGATIVE
Glucose Urine: NEGATIVE mg/dL
Ketones: NEGATIVE mg/dL
Leuk Esterase: NEGATIVE
Nitrite Urine: NEGATIVE
Occult Blood Urine: NEGATIVE mg/dL
Protein Urine: NEGATIVE mg/dL
Specific Gravity, Urine: 1.012 (ref 1.002–1.030)
Urobilinogen: NEGATIVE mg/dL (ref ?–2.0)
pH URINE: 7 (ref 4.8–7.8)

## 2020-07-13 LAB — CBC NO DIFFERENTIAL
Hematocrit: 20.7 % — ABNORMAL LOW (ref 36.0–46.0)
Hematocrit: 27.4 % — ABNORMAL LOW (ref 36.0–46.0)
Hemoglobin: 7.1 g/dL — ABNORMAL LOW (ref 12.0–16.0)
Hemoglobin: 9 g/dL — ABNORMAL LOW (ref 12.0–16.0)
MCH: 32.6 pg (ref 27.0–33.0)
MCH: 31.4 pg (ref 27.0–33.0)
MCHC: 34.2 % (ref 32.0–36.0)
MCHC: 33 % (ref 32.0–36.0)
MCV: 95.1 fL (ref 80.0–100.0)
MCV: 95.2 fL (ref 80.0–100.0)
MPV: 8.5 fL (ref 6.8–10.0)
MPV: 9.9 fL (ref 6.8–10.0)
Nucleated RBC/100 WBC: 1 % WBC
Platelet Count: 261 10*3/uL (ref 130–400)
Platelet Count: 190 10*3/uL (ref 130–400)
Platelet Estimate, Smear: ADEQUATE — AB
RDW: 13.3 % (ref 0.0–14.7)
RDW: 13.2 % (ref 0.0–14.7)
Red Blood Cell Count: 2.17 10*6/uL — ABNORMAL LOW (ref 4.00–5.20)
Red Blood Cell Count: 2.88 10*6/uL — ABNORMAL LOW (ref 4.00–5.20)
White Blood Cell Count: 4.9 10*3/uL (ref 4.5–11.0)
White Blood Cell Count: 6.3 10*3/uL (ref 4.5–11.0)

## 2020-07-13 MED ORDER — OXYCODONE 5 MG TABLET
ORAL_TABLET | ORAL | 0 refills | Status: AC
Start: 2020-07-13 — End: 2020-07-22
  Filled 2020-07-13: qty 30, 7d supply, fill #0

## 2020-07-13 MED ORDER — ENOXAPARIN 40 MG/0.4 ML SUBCUTANEOUS SYRINGE
40.0000 mg | INJECTION | SUBCUTANEOUS | 0 refills | Status: AC
Start: 2020-07-13 — End: 2021-07-08
  Filled 2020-07-13: qty 8.4, 21d supply, fill #0

## 2020-07-13 MED ORDER — ACETAMINOPHEN 325 MG TABLET
650.0000 mg | ORAL_TABLET | ORAL | 0 refills | Status: AC | PRN
Start: 2020-07-13 — End: 2021-07-08
  Filled 2020-07-13: qty 100, 9d supply, fill #0

## 2020-07-13 MED ORDER — OXYCODONE 5 MG TABLET
ORAL_TABLET | ORAL | 0 refills | Status: DC
Start: 2020-07-13 — End: 2020-07-13
  Filled 2020-07-13: qty 30, 9d supply, fill #0

## 2020-07-13 MED ORDER — DOCUSATE SODIUM 100 MG CAPSULE
100.0000 mg | ORAL_CAPSULE | Freq: Two times a day (BID) | ORAL | 11 refills | Status: AC
Start: 2020-07-13 — End: 2021-07-08
  Filled 2020-07-13: qty 60, 30d supply, fill #0

## 2020-07-13 MED ORDER — NALOXONE 4 MG/ACTUATION NASAL SPRAY
NASAL | 0 refills | Status: AC
Start: 2020-07-13 — End: 2021-07-13
  Filled 2020-07-13: qty 2, 14d supply, fill #0

## 2020-07-13 MED ORDER — POLYETHYLENE GLYCOL 3350 17 GRAM/DOSE ORAL POWDER
17.0000 g | Freq: Every day | ORAL | 3 refills | Status: AC
Start: 2020-07-14 — End: 2021-07-09
  Filled 2020-07-13: qty 238, 14d supply, fill #0

## 2020-07-13 NOTE — Discharge Instructions (Signed)
Orthopaedic Discharge Instructions    We are interested in your prompt and healthy recovery, so please follow these instructions. Understand that each surgery is different. These instructions are meant as a guideline and are not meant to cover all aspects of your recovery. If you have any questions that are not covered in these instructions, please call the Orthopaedic Clinic at [phone].    Reason for Admission: R periprosthetic femur fx  Your Surgery: ORIF R femur 07/08/20, Dr. Casey Burkitt    Medications  See your After Hospital Summary for list of the medications you are being discharged home on. Resume all home medication and start your discharge medication as directed.    Pain Management:   Managing pain is an important part of healing after an injury. The best way to manage injury pain is to combine pain medications with non-medicine therapies. There are many kinds of pain medicines. Your health and safety is our top priority. This document will explain how some common pain medications work and some alternative ways of coping with pain.    You are being discharged home on oxycodone for pain control that is not controlled by your other pain medications.  The first medicine you should take if prescribed is Tylenol around the clock as prescribed. If you are still having pain you may take ibuprofen and/or gabapentin if prescribed.  If after that you continue to have pain you may take the oxycodone. This is a strong narcotic pain medication. As your pain improves, you will need to decrease the amount of narcotic pain medication you are taking. This can be done be taking 1 instead of 2, or by cutting one in half, as well as increasing the interval of time between taking the medication.  Please note that many other medications may include acetaminophen (such as cold medicine) and you will need to limit your intake to less than 4 grams per day. More than this can cause damage to your liver.  If you are taking  narcotic pain medication, there are several common side effects. These include: dizziness, drowsiness, nausea, and constipation. Do NOT drive or operate heavy machinery while taking these medications. Take on a full stomach to minimize nausea.     Medication Refills:  All medication refills need to be requested Monday-Friday, 8-5, by contacting the Orthopedic clinic at the number below and requesting to speak to the nurse. Please allow 24-48 hours for a response. No refills will be done over the phone after hours, or on the weekends or holidays.  If medications are needed after hours or on the weekends, you will need to go to the Emergency Department if your pain is intolerable.    Please note, 6 weeks after your surgery, your Orthopaedic Surgeon will no longer fill your pain medication. It is important to become established with a Primary Doctor to assist you with further management of your pain after 6 weeks if you are unable to wean off of them.      Common pain medicines  Acetaminophen (Tylenol) is used to treat throbbing pain and fever. Do not take over 4 grams in 24 hours. Talk with your doctor or pharmacist is you are over 46 or have liver recomeendations for dosage recommendations.  NSAIDS (Ibuprofen, Ketorolac) are used to treat throbbing pain, fever, and inflammation. Talk with your doctor or pharmacist if you have had bleeding in your stomach or bowels, kidney injuries, or low platelets.  Gabapentin (Neurontin)is used to treat burning pain or nerve  pain. It can decrease the amount of opoid medicines you need. This medicine can cause sleepiness, stomach and bowel problems.If you have kidney problems, you may need a lower dose.  Opioid pain medicines (Oxycodone) may still be used to treat pain after surgery or trauma. They work best when combined with other medications listed above. They carry a serious risk of dependence and overdose. It is best if they are used for a short time only. You will be weaned  off these as soon as possible.     Other therapies for coping with pain  Medicines are not always the only way to treat pain. Here are a few different therapies you can try. Talk with your primary provider to learn more.  Temperature therapy and elevation: Using ice packs and raising your arm or leg can reduce pain and swelling. Heating pads can also help some kinds of pain.  Aromatherapy: Some patients have found that inhaling essential oils or using them on the skin helps ease pain.   Deep breathing and relaxation: These are two of the best ways to help you cope with pain. Many patients using these methods need less opioid pain medicine.  Distraction: Take your mind off of the pain by focusing on something you enjoy. You can try reading, watching a show, or talking with a friend.  Music therapy: Listening to music can help you cope with pain.     If you are taking narcotic pain medication, there are several common side effects. These include: dizziness, drowsiness, nausea, and constipation. Do NOT drive or operate heavy machinery while taking these medications. Take on a full stomach to minimize nausea.   Please note, 6 weeks after your surgery, your Orthopaedic Surgeon will no longer fill your pain medication. It is important to become established with a Primary Doctor to assist you with further management of your pain after 6 weeks if you are unable to wean off of them.    Avoid constipation:Be sure to drink plenty of fluids, eat a high fiber diet, and take your stool softeners as prescribed (Colace, Senna, Miralax are examples and are available over the counter if not prescribed), to help with constipation,. If it has been greater than 3 days since your last bowel movement notify your doctor or seek medical attention. If you have loose stool, please hold the stool softener.             Wound Care  Monitor for signs and symptoms of wound infection. If you develop a temperature greater than 101.5, shaking,  chills, redness or swelling around your incision site, drainage from your wound, or worsening pain, please notify the Orthopaedic Surgeon or go to the closest Emergency Department for further evaluation.  Dressings: Apply a dry dressing to your wound as instructed. This dressing will need to be changed as needed if  there is any drainage or it becomes wet or dirty. Your sutures will be removed in the clinic in 2-3 weeks. If you have steri-strips (white strips of tape) or Dermabond (clear skin glue) in place, do not remove these, they will fall off on their own.  Showering: Do not shower your incision site until after you are seen in the clinic in approximately 2 weeks. You will not be able to bathe or soak the wounds for 6 weeks, or until they are completely healed.  Cast/Splint Care: DO NOT get cast or splint wet; this can result in rash, infection, or surgical failure.If the cast or splint  gets wet with less than 8 oz. or water (about a can of soda), please blow dry using the LOW setting ONLY. If the cast or splint gets wet with more than 8 oz. of water or any amount of a substance other than water, please contact the emergency department to get the cast or splint changed.  DO NOT modify cast or splint in any way.  DO NOT place objects inside cast or splint.   DO NOT remove cast or splint without contacting a physician    Anticoagulation  You will be on an anticoagulant because of the increased risk of a clot in your blood stream. The anticoagulant you will be on is lovenox. You will be on this for 28 days. Because you are on this medication, there is a risk of increased bleeding if you cut or bruise yourself. This will require more pressure for a longer period of time to any cut, scratch, or bruise to control the bleeding. If you fall and hit your head, you should be seen at an emergency facility and let them know you are on a anticoagulant.  Should you develop any symptoms of blood clots:  calf pain or swelling,  thigh pain or swelling, chest pain or shortness of breath, go to the Emergency Department for evaluation.    Activity Restrictions  You will be weight bearing as tolerated to the right leg until further instruction in clinic.  Use your walker and wheelchair as instructed by Physical and Occupational Therapy.  If you develop any numbness or tingling to your operative extremity, or any new or increasing weakness, please notfiy your surgeon immediately  Swelling: Swelling is to be expected after surgery. You can minimize this by elevating your operative extremity above heart level and minimizing the time it is hanging down.  Ice: Using an ice pack after surgery one hour on and one hour off in addition to elevation can help with swelling as well. and DO NOT leave the ice pack on overnight or when you are sleeping  Physical Therapy: You will receive physical therapy in your home from an in home therapist and your ongoing need for physical therapy will be assessed at your next clinic visit.   Return to Work:  It is difficult to predict when you will be able to return to work or school. This will be addressed as you recover at your subsequent follow-up visits based on your healing and progress with physical therapy.     Follow Up  You will have a follow up appointment in the Orthopaedic Surgery Clinic with Dr. Gwynneth Aliment. Please call as soon as possible to schedule your appointment approximately 2 weeks after surgery.  [facility]  [address], [place]  [place], [place]  [address]  [phone]    Follow up with your PCP as soon as possible to discuss your hospitalization.    Contact info  Should you have any issues and need to contact your surgeon:  [place], [place]    [facility]  After hours and weekends, ask to speak to the Orthopaedic Resident on-call  [phone]    If you are unable to reach a physician, you may go to the Emergency Department.

## 2020-07-13 NOTE — Progress Notes (Addendum)
Orthopaedic Trauma Progress Note  Date: 07/13/2020  Unit: [id number]  Attending of Record: Attending Provider: Quentin Angst, MD    ID:  Cristina Carter is a 80yrold female with right periprosthetic hip fracture after GLF.      Procedures:    7/2 (GS): ORIF R femur for periprostheticTHA fracture    Interval Events/Subjective:  Pain improving.    Objective:  Temp src: Oral (07/07 0342)  Temp:  [36.4 C (97.5 F)-37.3 C (99.1 F)]   Pulse:  [72-95]   BP: (104-143)/(63-71)   Resp:  [15-19]   SpO2:  [94 %-97 %]   I/O Last 2 Completed Shifts:  In: 360 [Oral:360]  Out: 950 [Urine:950]     PE:  Gen: NAD    Right lower extremity:  Inspection: dressing with serosanguinous drainage as pictured in chart  Drain: none  Vascular: palpable dorsalis pedis pulse, toes warm and well perfused, capillary refill < 2s  Motor function: intact DF/PF/EHL  Sensory: S/S/SP/DP/T intact to light touch    Laboratory Tests:  Lab Results   Lab Name Value Date/Time    WBC 4.9 07/13/2020 05:45 AM    HGB 7.1 (L) 07/13/2020 05:45 AM    HCT 20.7 (L) 07/13/2020 05:45 AM    PLT 261 07/13/2020 05:45 AM     Lab Results   Lab Name Value Date/Time    NA 136 07/11/2020 10:41 AM    K 4.7 07/11/2020 10:41 AM    CL 106 07/11/2020 10:41 AM    CO2 20 (L) 07/11/2020 10:41 AM    BUN 14 07/11/2020 10:41 AM    CR 0.90 07/11/2020 10:41 AM    GLU 181 (H) 07/11/2020 10:41 AM       Assesment and Plan:  Cristina Carter a 89yrld female with [place] B1 periprosthetic femur fracture s/p ORIF on 7/2.    # R periprosthetic femur fracture  - Dressing: change so no longer saturated  - Drains: none  - Weight bearing status: WBAT RLE  - Imaging: preop complete  - Cultures: none  - Antibiotics: 24h abx (complete 7/3)    General:  - Pain Management: PO with IV rescue  - PT/OT  - Physical Precautions: none  - Foley: none  - DVT Prophylaxis: LMWH    Dispo: pain control, physical therapy        BiMegan SalonMD PGY-5  [place]  [facility]  PI: [phone]: [phone]    Orthopaedic Trauma Contact List:     Primary/1st Call:   FOR PATIENTS ON D14 AND D11 PLEASE PAGE: NP [phone] (7d/week 7am-6pm)  FOR PATIENTS ON ALL OTHER FLOORS PLEASE PAGE: NP [phone] (7d/week 7am-6pm)  2nd Call: Randhawa [phone]   3rd Call: [place]ithan [phone]  4th Call: HaLaverta [place]2961  5th Call: BaDenny [phone]-cover: [phone]-cover is primary contact 7d/week 6pm-7am  (This service has an in house resident covering all orthopedic patients. Please limit pages between the hours of 6pm-6am to critical issues)    NP addendum:   DME: \18 in standard wheelchair  elevating leg rests,necessary due to right lower extremity injury that  must be elevated while seated, drop arms for transferring between chair and bed or commode/toilet for safety.   Elevated leg rests are necessary as (choose all that apply):  .    -The patient has significant edema of lower extremities that requires an elevated leg rest.      Patient has  a mobility limitation that prevents from completing toileting, feeding, dressing, grooming, and/or bathing, AND> The mobility limitation cannot be sufficiently resolved with use of cane or walker, AND> Wheelchair will improve patient's ability to complete MRADLs, AND> Patient has sufficient upper-extremity and mental capabilities to self-propel, or patient has a caregiver who is able and willing to assist with wheelchair      I saw and evaluated the patient and agree with the resident/fellow's and NP's note/plan as above.     Quentin Angst, MD

## 2020-07-13 NOTE — Nurse Focus (Signed)
18" w/c Durable Medical Equipment (DME) approved per inpatient case management, labeled and p/u by [facility].

## 2020-07-13 NOTE — Progress Notes (Signed)
ORTHOPEDIC CO-MANAGEMENT DAILY PROGRESS NOTE    Date: 07/13/2020 Time: 10:45    HANDOFF SUMMARY  80yrold female patient with history of HTN presenting with R hip fracture.  Edited by: SLorn Junes MD at 07/07/[id number]   ------------------------------------------------------------------------------------------------------------------  ASSESSMENT AND PLAN  89yrld female patient with history of HTN presenting with R hip fracture.  Edited by: SrLorn JunesMD at 07/07/2020 [phone]  Summary of Recommendations:  1. Suspected CAUTI: c/o new dysuria, increased urinary frequency, and suprapubic abdo pain. I&O cath x 1 ordered for UA and culture. If positive will treat with abx. PVR bladder scan PRN ordered to evaluate for ongoing urinary retention  2. Acute blood loss anemia, likely 2/2 surgical blood loss. Please check afternoon Hgb, if <7 would transfuse 1u PRBC  3. Hypoxic RF has resolved, stable on room air.     # Suspected CAUTI  Foley was removed per protocol but patient now c/o dysuria, increased urinary frequency and suprapubic abdo pain.   - f/u UA and culture if indicated  - I&O cath x 1 to obtain specimen  - PVR bladder scan PRN, contact MD if PVR >300    # Acute hypoxic RF 2/2 atelectasis, resolving  CXR without signs of atelectasis, pulm edema or infiltrates. Lasix '20mg'$  IV x 1 given. IVF discontinued. Crackles still present at LLL, but otherwise CTA.   - aggressive incentive spirometry  - Mobilize as tolerated  - wean O2 to target O2 sat >92%    #Acute blood loss anemia, normocytic, 2/2 surgery  -Monitor post op, transfuse if Hgb <7    #R Periprosthetic hip fracture  -Defer to ortho for management    # GERD  - continue famotidine Q12H PRN  - could consider starting PPI vs defer to outpatient setting    #Elevated lipase  No clinical correlation-- no abd pain, no imaging abnormalities.   - No further work up; monitor    #Osteoporosis  #Fragility fracture  #Pathologic Fracture  PTH wnl,  vit D, 25-OH wnl, phos WNL, [place] wnl.   - vitamin D/Calcium supplement 1 tab BID ordered              -Physical therapy outpatient (SNF vs. HH vs. Outpatient)             -Include patient education with dot phrase .FRAGILITYFXDC in patient discharge instructions  -PCP should order baseline DEXA scan outpatient if not completed in the past year  -PCP should order bone health labs (Ca, Vit D) 3 months after discharge  - Based on Vit D level, will order supplementation; level normal               #HTN  - Continue amlodipine and losartan    FEN: SPECIAL TRAY REQUEST  MECHANICAL SOFT DIET (INTACT OR CHOPPED)  Last BM: Last Bowel Movement: 07/13/20  DVT Prophylaxis: VTE prophylaxis contraindicated 2/2 surgery  Disposition: pending PT/OT eval    ------------------------------------------------------------------------------------------------------------------    SUBJECTIVE  Patient [place] with [place] interpreter [place]. Endorsing increased urinary frequency, suprapubic abdo pain, and dysuria. Denies N/V. Endorses some mild diffuse abdo pain. Having looser stools.  Denies SOB    REVIEW OF SYSTEMS  + diarrhea  + Dysuria  + suprapubic abdo pain  + urinary frequency  Denies N/V  Denies SOB  Denies CP    Medications  Acetaminophen (TYLENOL) Tablet 1,000 mg, ORAL, Q8H  Amlodipine (NORVASC) Tablet 5 mg, ORAL, QAM  Atorvastatin (  LIPITOR) Tablet 10 mg, ORAL, Daily Bedtime  Calcium-Cholecalciferol (D3) (OSCAL 500+D) Tablet 1 tablet, ORAL, BID  Docusate (COLACE) Capsule 100 mg, ORAL, BID  Enoxaparin (LOVENOX) Injection 30 mg, SUBCUTANEOUS, Q12H  Losartan (COZAAR) Tablet 100 mg, ORAL, QAM  Polyethylene Glycol 3350 (MIRALAX) Oral Powder Packet 17 g, ORAL, QAM  Sennosides (SENOKOT) Tablet 17.2 mg, ORAL, Daily Bedtime        Acetaminophen (TYLENOL) Tablet 650 mg, ORAL, Q4H PRN  FamoTIDine (PEPCID) Tablet 20 mg, ORAL, Q12H PRN  Oxycodone (ROXICODONE) Tablet 5 mg, ORAL, RESCUE Q4H PRN   Or  Hydromorphone (DILAUDID) Injection 0.2  mg, IV, RESCUE Q2H PRN  Magnesium Hydroxide (MILK OF MAGNESIA) 400 mg/5 mL Suspension 30 mL, ORAL, Q12H PRN  Naloxone (NARCAN) Injection 0.1 mg, IV, Q2MIN PRN  Oxycodone (ROXICODONE) Tablet 5 mg, ORAL, Q4H PRN   Or  Oxycodone (ROXICODONE) Tablet 10 mg, ORAL, Q4H PRN        OBJECTIVE    Vitals Signs  Current Vitals  Temp: 37 C (98.6 F)  BP: 134/58  Pulse: 88  Resp: 18  SpO2: 95 %  Flow (L/min): 1  Weight: 73.6 kg (162 lb 4.1 oz)    Intake and Output  Last Two Completed Shifts  In: 540 [Oral:540]  Out: 450 [Urine:450]    Current Shift  No intake/output data recorded.    PHYSICAL EXAM   Gen: Elderly woman appears stated age, in no acute distress  HEENT: EOMI, anicteric sclera, NCAT  Neck: supple  CV: systolic murmur heard at RUSB, no other rubs or gallops, regular rhythm  Lungs:crackles present in LLL  Abdo: +BS, soft, mildly TTP diffusely but without rebound or guarding  Ext: trace edema of R leg (surgical leg). No edema left leg.     Lines and Drains     Incision/Wound/Pressure Injury 07/08/20 Incision Leg Right (Active)       Peripheral IV 07/08/20 Anterior;Right Forearm (Active)        Labs and Studies  I have reviewed the following information from the last 24 hours: allied health and treating physician notes, imaging and labs and microbiology data    Please see top of the note for assessment and plan    Approximately 25 minutes were spent face to face with the patient, of which more than 50% was spent in counseling and/or coordination of care on 07/10/20. The patient understands and agrees with the plan of care as outlined       Report Electronically Signed by:   Caswell Corwin, MD   Associate Physician  [facility]  Pager: [phone]  PI: [phone]    Date: 07/13/2020 Time: 10:45

## 2020-07-13 NOTE — Nurse Focus (Signed)
Pt received a 18"WC Durable Medical Equipment (DME) per inpatient case management and delivered to unit/patient.

## 2020-07-13 NOTE — Allied Health Progress (Signed)
PHYSICAL THERAPY NOTE      Date of Note:  07/13/2020    Isolation Precautions: Personal Protective Equipment Utilized: Gloves and Surgical Mask    Was a new therapy order generated with a "Pending Discharge" priority designation?  No. Not applicable--A new PT/OT order was not issued        CURRENT Discharge Recommendations:        Disposition: Home         Is Patient Cleared for Disposition above: yes  Current Level of Assistance or Supervision:  Minimal Assist for ADLs/Mobility  Continued Physical Therapy:  Home Health PT                            Equipment Size: Adult Standard (Pt height 5'3"-6'4")        Equipment:  Wheelchair: 18" standard, manual WC, removable armrests, removable legrests, removable elevating legrests and anti-tips      Celesta Aver  Physical Therapist II  Vocera: [phone]   Tiger text available

## 2020-07-13 NOTE — Nurse Discharge Note (Signed)
NURSE Discharge NOTES    Note Started: 07/13/2020, 19:06     Pt is stable for discharge as cleared by Healthcare Team.     Martin Majestic Over the following:    1. Restrictions with Activity and Diet Recommendation.  2. Taking Home medications and PRN Safely.  3. Follow-up Appointments and Check up with Primary MD.  4. When to seek Medical attention and possible signs of infection.    Reviewed AHS material with son, daughter and patient. Belongings sent with daughter. Pt stated understanding of AHS material and plan of care for discharge. Pt is discharged to home in stable condition, accompanied by pt escort. Careplan resolved.    Madison Hickman, RN

## 2020-07-13 NOTE — Care Plan (Signed)
Problem: Adult Inpatient Plan of Care  Goal: Plan of Care Review  Outcome: Ongoing, Progressing  Flowsheets (Taken 07/13/2020 0616)  Outcome Evaluation: Pt slept on and off during the shift. VSS and afebrile, and pain well controlled. The surgical site intact, [employer] dressing in place. Pt able to self reposition in bed using overhead trapez. Actively using the IS spirometer peaking up to 1000. Tolerating oral dietary and fluids. Pt incontient of urine and faeces. Hygiene and CHG attended. The call light within reach, bed alarmed and locked in low position. Regular rounding and patient safety observed.

## 2020-07-13 NOTE — Allied Health Progress (Signed)
PHYSICAL THERAPY PROGRESS NOTE & DISCHARGE SUMMARY    Patient Name: Cristina Carter  MRN: [id number]   Date of Service: 07/13/2020   Hospital Unit: D14  Time in: 1101  Total time: 30 Minutes  Personal Protective Equipment Utilized: Gloves and Surgical Mask    Was a new therapy order generated with a "Pending Discharge" priority designation?  No. Not applicable--A new PT/OT order was not issued     RN cleared patient to work with PT.      07/13/20 1300   Physical Therapy Time and Intention   Mode of Treatment individual therapy;physical therapy   Total Minutes, Physical Therapy 30   Subjective Pt amenable to participate in PT. C/o R LE pain but better than yesterday   Pain Assessment   Pretreatment Pain Rating 5/10   Posttreatment Pain Rating 8/10   Pre/Posttreatment Pain Comment R LE   General Information   Existing Precautions/Restrictions fall;weight bearing as tolerated   Weight Bearing as Tolerated RLE   Isolation Precautions protective environment maintained   Observation   General Observations of Patient femal supine in bed with IV acess   Bed Mobility   Scooting, Supine Assistance (Bed Mobility) minimal assist (75-99% patient effort)   Scooting, Edge of Bed Assistance (Bed Mobility) minimal assist (75-99% patient effort)   Supine-Sit Independence (Bed Mobility) minimal assist (75-99% patient effort)   Sit-Supine Independence (Bed Mobility) minimal assist (75-99% patient effort)   Assistive Device (Bed Mobility) No use of hospital bed features   Which direction did they exit bed today? right   Which direction does the pt normally exit bed? right   Comment, (Bed Mobility) Pt's daughter was instructed on how to assist pt for supine<>sit with proper posture.   Transfers   Sit-Stand Independence (Transfers) contact guard;standby assist   Stand-Sit Independence (Transfers) contact guard;standby assist   Assistive Device Size (Transfer) Youth (Pt height 4'7"- 5'3")   Assistive Device (Transfers) walker, front-wheeled    Comment, (Transfers) Pt's daughter was instructed on how to assist pt for sit<>stand with proper posture.   Gait Assessment   Independence Level (Gait) contact guard   Assistive Device Size (Gait) Youth (Pt height 4'7"- 5'3")   Assistive Device (Gait) walker, front-wheeled   Distance in Feet (Gait) 25 x 2   Pattern (Gait) step-to;step-through   Deviations/Abnormal Patterns (Gait) right sided deviations   Right Sided Gait Deviations antalgic   Comment, Gait Pt's daughter was instructed on how to assist pt while ambulating and with proper posture.   Balance   Static Sitting Balance modified independence   Dynamic Sitting Balance standby assist   Static Standing Balance contact guard   Dynamic Standing Balance contact guard   Activity Tolerance   Activity Tolerance good;fair   Safety Issues, Functional Mobility   Impairments Affecting Function (Mobility) pain;balance   Discharge Summary (PT)   Reason for Discharge (PT) patient met all goals and outcomes   Outcomes Achieved/Progress Made Upon Discharge (PT) goals partially achieved prior to discharge;progress was made toward goals   Discharge Summary Statement (PT) Pt is cleared from PT from functional mobility stand point and can be discharge to home when medically stable. Family training was completed and patient's daughter was able to provide good return demonstration. Patient's daughter comfortable taking her home and provide 24/7 supervision.        Patient Status after PT treatment:  Pt was positioned for comfort and safety with call light and all needs within reach.  Care transitioned back to  RN and discussed patient response to treatment.      Physical Therapy Goals:    Bed Mobility: Pt will improve from mod A to CGA, without use of bed features, for supine/sit and moving up in bed. - MET  Transfers: Pt will improve from min A to CGA for bed, chair, toilet, and FWW transfers. - MET  Gait: Pt will walk 75 ft with FWW and CGA. - partially met, able to ambulate  50 ft  Patient Education: Pt will demonstrate appropriate safety awareness during all mobility to decrease fall risk. - MET  Caregiver Education: Caregiver will safely provide any needed physical A. - MET      CURRENT Discharge Recommendations:        Disposition: Home with Assistance--daughter        Is Patient Cleared for Disposition above: yes  Current Level of Assistance or Supervision:  Minimal Assist for ADLs/Mobility  Continued Physical Therapy:  Home Health PT                            Equipment Size: Child (Pt height 4'0"-4'8")        Equipment:  Walker: front wheeled walker   Wheelchair: 18" standard, manual WC, removable armrests, removable legrests, removable elevating legrests and anti-tips    PT Plan for Next Visit: Continue with current plan of care.    Celesta Aver  Physical Therapist II  Vocera: [phone]   Tiger text available

## 2020-07-13 NOTE — Discharge Summary (Signed)
-----------------------------------------------------------------------  Important Comments to Subsequent Providers:    -PCP should order baseline DEXA scan outpatient if not completed in the past year  -PCP should order bone health labs ([place], Vit D) 3 months after discharge  - Based on Vit D level, will order supplementation; level normal  ----------------------------------------------------------------------    Incidental Findings that need PCP Follow-up:     none  -----------------------------------------------------------------------    Inpatient Discharge Summary    Date of Admit:  07/06/2020    Date of discharge:   07/13/2020    Attending Physician:  Ronnald Ramp*    Service:  Orthopaedic Surgery    Diagnosis:   R periprosthetic femur fx    Procedures: ORIF R femur 07/08/20, Dr. Casey Burkitt      HPI:  Cristina Carter is a 80yrold female [facility]. MSandie Harkewas moving some bottles when she tripped over one of them and fell to the ground. She typically ambulates with a walker. ~15 years [place] [facility]   Imaging demonstrated periprosthetic femur fracture with stable stem. Patient was admitted and planned for operative intervention.    Consultations: hospital medicine    Complications: none    Hospital course:     S/p ORIF Right femur 07/08/20 Dr. GCasey Burkitt Weight Bearing status: wbat rle  Post-op films: complete  Antibiotics: completed antibiotics for 24 hours post-op  Wound: Healing well. Dry dressing in place, plan for suture removal in 2 weeks.  PT/OT: Working with patient and cleared for discharge home with use of FWW and WC  Pain: Pain controlled postoperatively with multi-modal medications, including acetaminophen,and oxycodone.  These medications will continued at discharge with a plan to taper the  narcotics.   DVT prophylaxis:  She received chemical anticoagulation for VTE prophylaxis with lovenox while hospitalized. She will be discharged home with lovenox for 28 days total.  At the time of discharge, the patient was tolerating a regular diet, voiding appropriately, and having normal BM.  She was seen this morning and she is medically cleared for discharge to home with close follow up in the Orthopaedic Surgery Clinic.    # Dysuria  # Suprapubic abdo pain  UA negative     # Acute hypoxic RF 2/2 atelectasis, resolving  CXR without signs of atelectasis, pulm edema or infiltrates. Lasix '20mg'$  IV x 1 given.IVF discontinued. Crackles still present at [facility], but otherwise CTA.Was stable on room air on 7/6.   - aggressive incentive spirometry  - Mobilize as tolerated    #Acute blood loss anemia, normocytic, 2/2 surgery  Hgb downtrending, likely related to blood loss during surgery.   -Monitor post op, transfuse if Hgb <7  Improved to hgb 9 on day of dc     # GERD  - continue famotidine Q12H PRN  - could consider starting PPI vs defer to outpatient setting    #Elevated lipase  No clinical correlation-- no abd pain, no imaging abnormalities.   - No further work up; monitor    #Osteoporosis  #Fragility fracture  #Pathologic Fracture  PTH wnl, vit D, 25-OH wnl, phos WNL wnl.   - vitamin D/Calcium supplement 1 tab BID ordered   -Physical therapy outpatient (SNF vs. HH vs. Outpatient)    -PCP should order baseline DEXA scan outpatient if not completed in  the past year  -PCP should order bone health labs (Ca, Vit D) 3 months after discharge  - Based on Vit D level, will order supplementation; level normal    #HTN  - Continue amlodipine and losartan    Condition:  Stable.       Vital signs:  Temp: 36.8 C (98.2 F) (07/07 1417)  Temp src: Oral (07/07 1417)  Pulse: 91 (07/07 1417)  BP: 117/59 (07/07 1417)  Resp: 18 (07/07 1417)  SpO2: 96 % (07/07 1417)  Height: 152.4 cm (5') (07/01  1900)  Weight: 73.6 kg (162 lb 4.1 oz) (07/01 1900)      PE:  Gen: NAD    Right lower extremity:  Inspection: dressing with serosanguinous drainage as pictured in chart  Drain: none  Vascular: palpable dorsalis pedis pulse, toes warm and well perfused, capillary refill < 2s  Motor function: intact DF/PF/EHL  Sensory: S/S/SP/DP/T intact to light touch    Laboratory Results:   CBC   Recent labs for the past 72 hours     07/11/20  1041 07/12/20  0607 07/13/20  0545 07/13/20  1401   WBC 5.8 5.7 4.9 6.3   HGB 7.4* 7.2* 7.1* 9.0*   HCT 21.2* 20.9* 20.7* 27.4*   PLT 189 207 261  --       BASIC METABOLIC PANEL   Recent labs for the past 72 hours     07/11/20  1041   GLU 181*   BUN 14   CR 0.90   NA 136   K 4.7   CL 106   CO2 20*   CA 8.2*        INR No results for input(s): INR in the last 72 hours.     (RETIRED [phone]) (Retired) POC Glucose, blood: --      Pertinent Culture Results:  NA    Plan:  Discharge to home with [facility]    Discharge Medications:   CURES report was run and reviewed prior to ordering discharge medication.   1. Medications:    Current Discharge Medication List      START taking these medications    Details   !! Acetaminophen (TYLENOL) 325 mg Tablet Take 2 tablets by mouth every 4 hours if needed.  Qty: 100 tablet, Refills: 0      Docusate (COLACE) 100 mg Capsule Take 1 capsule by mouth 2 times daily.  Qty: 60 capsule, Refills: 11      Enoxaparin (LOVENOX) 40 mg/0.4 mL Syringe Inject 40 mg (1 syringe) subcutaneously every 24 hours.  Qty: 21 each, Refills: 0      Naloxone (NARCAN) 4 mg/actuation Nasal Spray Use for opioid overdose. Give 1 spray in nostril. If no response / breathing slows down, repeat every 2 minutes until emergency help arrives.  Qty: 2 each, Refills: 0      Oxycodone (ROXICODONE) 5 mg Tablet Take 1-2 tablets by mouth every 4 to 6 hours if needed for 3 days, THEN 1 tablet every 6 hours if needed for 3 days, THEN 0.5 tablets every 4 to 6 hours if needed.  Qty: 30 tablet, Refills: 0       Polyethylene Glycol 3350 (MIRALAX) 17 gram/dose Powder Mix 17 g (1 capful) in 4 to 8 oz of water, soda, coffee, juice or tea and take by mouth every morning.  Qty: 238 g, Refills: 3       !! - Potential duplicate medications found. Please discuss with provider.  CONTINUE these medications which have NOT CHANGED    Details   !! acetaminophen (TYLENOL PO) Take 1 tablet by mouth every 4 hours if needed.      Amlodipine (NORVASC) 5 mg Tablet Take 5 mg by mouth every day. Indications: high blood pressure      Atorvastatin (LIPITOR) 10 mg Tablet Take 10 mg by mouth every day at bedtime.      Fluticasone (FLONASE ALLERGY RELIEF) 50 mcg/actuation nasal spray Instill 1 spray into EACH nostril once daily if needed.      folic acid/multivit-min/lutein (CENTRUM SILVER PO) Take 1 tablet by mouth every day.      Losartan (COZAAR) 50 mg Tablet Take 100 mg by mouth every day.       !! - Potential duplicate medications found. Please discuss with provider.          Follow up:   No future appointments.   Soles, Luna Kitchens, MD  [place]  [place] [address]  [phone]    Follow up in 2 week(s)  For suture removal, For wound re-check    Colbert Coyer, PA  [address]  [place] [address]  [phone]    Schedule an appointment as soon as possible for a visit in 2 week(s)  Regarding hospitalization      PATIENT INSTRUCTIONS    Orthopaedic Discharge Instructions    We are interested in your prompt and healthy recovery, so please follow these instructions. Understand that each surgery is different. These instructions are meant as a guideline and are not meant to cover all aspects of your recovery. If you have any questions that are not covered in these instructions, please call the Orthopaedic Clinic at [phone].    Reason for Admission: R periprosthetic femur fx  Your Surgery: ORIF R femur 07/08/20, Dr. Casey Burkitt    Medications  See your After Hospital Summary  for list of the medications you are being discharged home on. Resume all home medication and start your discharge medication as directed.    Pain Management:   Managing pain is an important part of healing after an injury. The best way to manage injury pain is to combine pain medications with non-medicine therapies. There are many kinds of pain medicines. Your health and safety is our top priority. This document will explain how some common pain medications work and some alternative ways of coping with pain.    You are being discharged home on oxycodone for pain control that is not controlled by your other pain medications.  The first medicine you should take if prescribed is Tylenol around the clock as prescribed. If you are still having pain you may take ibuprofen and/or gabapentin if prescribed.  If after that you continue to have pain you may take the oxycodone. This is a strong narcotic pain medication. As your pain improves, you will need to decrease the amount of narcotic pain medication you are taking. This can be done be taking 1 instead of 2, or by cutting one in half, as well as increasing the interval of time between taking the medication.  Please note that many other medications may include acetaminophen (such as cold medicine) and you will need to limit your intake to less than 4 grams per day. More than this can cause damage to your liver.  If you are taking narcotic pain medication, there are several common side effects. These include: dizziness, drowsiness, nausea,  and constipation. Do NOT drive or operate heavy machinery while taking these medications. Take on a full stomach to minimize nausea.     Medication Refills:  All medication refills need to be requested Monday-Friday, 8-5, by contacting the Orthopedic clinic at the number below and requesting to speak to the nurse. Please allow 24-48 hours for a response. No refills will be done over the phone after hours, or on the weekends or  holidays.  If medications are needed after hours or on the weekends, you will need to go to the Emergency Department if your pain is intolerable.    Please note, 6 weeks after your surgery, your Orthopaedic Surgeon will no longer fill your pain medication. It is important to become established with a Primary Doctor to assist you with further management of your pain after 6 weeks if you are unable to wean off of them.      Common pain medicines  Acetaminophen (Tylenol) is used to treat throbbing pain and fever. Do not take over 4 grams in 24 hours. Talk with your doctor or pharmacist is you are over 11 or have liver recomeendations for dosage recommendations.  NSAIDS (Ibuprofen, Ketorolac) are used to treat throbbing pain, fever, and inflammation. Talk with your doctor or pharmacist if you have had bleeding in your stomach or bowels, kidney injuries, or low platelets.  Gabapentin (Neurontin)is used to treat burning pain or nerve pain. It can decrease the amount of opoid medicines you need. This medicine can cause sleepiness, stomach and bowel problems.If you have kidney problems, you may need a lower dose.  Opioid pain medicines (Oxycodone) may still be used to treat pain after surgery or trauma. They work best when combined with other medications listed above. They carry a serious risk of dependence and overdose. It is best if they are used for a short time only. You will be weaned off these as soon as possible.     Other therapies for coping with pain  Medicines are not always the only way to treat pain. Here are a few different therapies you can try. Talk with your primary provider to learn more.  Temperature therapy and elevation: Using ice packs and raising your arm or leg can reduce pain and swelling. Heating pads can also help some kinds of pain.  Aromatherapy: Some patients have found that inhaling essential oils or using them on the skin helps ease pain.   Deep breathing and relaxation: These are two of  the best ways to help you cope with pain. Many patients using these methods need less opioid pain medicine.  Distraction: Take your mind off of the pain by focusing on something you enjoy. You can try reading, watching a show, or talking with a friend.  Music therapy: Listening to music can help you cope with pain.     If you are taking narcotic pain medication, there are several common side effects. These include: dizziness, drowsiness, nausea, and constipation. Do NOT drive or operate heavy machinery while taking these medications. Take on a full stomach to minimize nausea.   Please note, 6 weeks after your surgery, your Orthopaedic Surgeon will no longer fill your pain medication. It is important to become established with a Primary Doctor to assist you with further management of your pain after 6 weeks if you are unable to wean off of them.    Avoid constipation:Be sure to drink plenty of fluids, eat a high fiber diet, and take your stool softeners as prescribed (  Colace, Senna, Miralax are examples and are available over the counter if not prescribed), to help with constipation,. If it has been greater than 3 days since your last bowel movement notify your doctor or seek medical attention. If you have loose stool, please hold the stool softener.             Wound Care  Monitor for signs and symptoms of wound infection. If you develop a temperature greater than 101.5, shaking, chills, redness or swelling around your incision site, drainage from your wound, or worsening pain, please notify the Orthopaedic Surgeon or go to the closest Emergency Department for further evaluation.  Dressings: Apply a dry dressing to your wound as instructed. This dressing will need to be changed as needed if  there is any drainage or it becomes wet or dirty. Your sutures will be removed in the clinic in 2-3 weeks. If you have steri-strips (white strips of tape) or Dermabond (clear skin glue) in place, do not remove these, they  will fall off on their own.  Showering: Do not shower your incision site until after you are seen in the clinic in approximately 2 weeks. You will not be able to bathe or soak the wounds for 6 weeks, or until they are completely healed.  Cast/Splint Care: DO NOT get cast or splint wet; this can result in rash, infection, or surgical failure.If the cast or splint gets wet with less than 8 oz. or water (about a can of soda), please blow dry using the LOW setting ONLY. If the cast or splint gets wet with more than 8 oz. of water or any amount of a substance other than water, please contact the emergency department to get the cast or splint changed.  DO NOT modify cast or splint in any way.  DO NOT place objects inside cast or splint.   DO NOT remove cast or splint without contacting a physician    Anticoagulation  You will be on an anticoagulant because of the increased risk of a clot in your blood stream. The anticoagulant you will be on is lovenox. You will be on this for 28 days. Because you are on this medication, there is a risk of increased bleeding if you cut or bruise yourself. This will require more pressure for a longer period of time to any cut, scratch, or bruise to control the bleeding. If you fall and hit your head, you should be seen at an emergency facility and let them know you are on a anticoagulant.  Should you develop any symptoms of blood clots:  calf pain or swelling, thigh pain or swelling, chest pain or shortness of breath, go to the Emergency Department for evaluation.    Activity Restrictions  You will be weight bearing as tolerated to the right leg until further instruction in clinic.  Use your walker and wheelchair as instructed by Physical and Occupational Therapy.  If you develop any numbness or tingling to your operative extremity, or any new or increasing weakness, please notfiy your surgeon immediately  Swelling: Swelling is to be expected after surgery. You can minimize this by  elevating your operative extremity above heart level and minimizing the time it is hanging down.  Ice: Using an ice pack after surgery one hour on and one hour off in addition to elevation can help with swelling as well. and DO NOT leave the ice pack on overnight or when you are sleeping  Physical Therapy: You will receive physical therapy  in your home from an in home therapist and your ongoing need for physical therapy will be assessed at your next clinic visit.   Return to Work:  It is difficult to predict when you will be able to return to work or school. This will be addressed as you recover at your subsequent follow-up visits based on your healing and progress with physical therapy.     Follow Up  You will have a follow up appointment in the Orthopaedic Surgery Clinic with Dr. Gwynneth Aliment. Please call as soon as possible to schedule your appointment approximately 2 weeks after surgery.  [facility]  [address] [place]  [place], [place]  [address]  [phone]    Follow up with your PCP as soon as possible to discuss your hospitalization.    Contact info  Should you have any issues and need to contact your surgeon:  [place], [place]    [facility]  After hours and weekends, ask to speak to the Orthopaedic Resident on-call  [phone]    If you are unable to reach a physician, you may go to the Emergency Department.         Kelven Flater (Lavell Luster) Kourtnei Rauber, NP  Ortho Trauma  Pager# [phone]

## 2020-07-13 NOTE — Nurse Assessment (Signed)
ASSESSMENT NOTE    Note Started: 07/13/2020, 08:06     Initial assessment completed and recorded in EMR.  Report received from night shift nurse and orders reviewed. Spanish speaking only. [place] interpreter utilized. Rec'd awake, A/O x4. VSS. S/P ORIF r hip peripros fx. R hip island dsg c/d/i. CSMs intact. Instructed to call nurse for any assistance. Call light w/in reach. Emergency equipment at bs. Plan of Care reviewed and appropriate, discussed with patient.  Lorretta Harp, RN RN

## 2020-07-13 NOTE — Clinical Case Management (Addendum)
Clinical Case Management Assessments    Name: Cristina Carter  MRN: [id number]   Date of Birth: [birth year] (10yr Gender: female    Note Date: 07/13/2020 Note Time: 13:48       DISCHARGE PLANNING NOTE    [facility]  Takeback Letter: Not Applicable    [place] [address] [place] Discharge Address: same as above    Patient can follow-up with:   PCP: LColbert Coyer PA  / Phone Number: [phone] [phone]   Funding/Billing: [place]   Reason for admission: right periprosthetic hip fracture after GLF.    Patient able to participate in plan?: Yes  Living arrangements: Children  Type of Residence: private residence  Home Environment (floors/stairs): single storey with one small step to enter  Who will be the primary caregiver after discharge?  Phone #? : SAilah Hagelstein(son)- [phone] Pre-Hospitalization self-care deficits: None  Pre-Hospitalization mobility: Independent, Modified Independent  Type of home health care services in place: None  Anticipated home health care services: Home PT, Home OT, Home RN  Anticipated Home Infusion: None  DME in place: None  Anticipated DME: None  Patient's goal upon discharge (in patient's own words) : To go home  Anticipated Disposition:  (SNF vs Home)  Has provider discussed post-acute care options with the patient?: Yes  Does the patient have ongoing DC Planning needs?: TBD  Is the patient ready for discharge today?: No  Does this patient have a planned readmission?: No  Anticipated Barriers to Discharge:  (pending final DC plan/recommendations)       No post-discharge plans have been finalized at this time.    Comments:     1100H: [place]. Pt was accepted by [facility]Phone: [phone] [place]      Patient will benefit from the use of a standard manual wheelchair within the home for MRADLs in a timely manner. Patient has a mobility limitation that prevents from completing toileting, feeding, dressing, grooming, and bathing (MRADLs). The mobility limitation cannot be sufficiently resolved with use of cane or walker. The wheelchair will improve patient's ability to complete MRADLs. Patient has sufficient upper-extremity and mental capabilities to self-propel, or patient has a caregiver who is able and willing to assist with wheelchair.  The patient is s/p ORIF R femur for periprostheticTHA fracture that requires an elevated leg rest. Patient can self-propel and needs the Anti-Tips because of ramps.  Patient requires removable arms for the wheel chair ordered, for transferring from wheel chair to/from the bed or commode.      Follow-up/recommendations:    MRock Nephew BSN , RN  Clinical Case Manager  Department of Clinical Case Management  Pager #: [phone] Available at [facility] Teams

## 2020-07-13 NOTE — Clinical Case Management (Addendum)
Clinical Case Management    Care Coordination Note    Task Action Result Signed   DME: Wheelchair     CMA received message from [place] that they will get the wheelchair from [place]. CMA cancelled and closed out DME referral in [place]. Bennie Hind, Case Manager Assistant  07/13/20 14:23           Task Action Result Signed   DME: Wheelchair     CMA obtained necessary documents to send out referral. CMA sent out referral to multiple vendors via [employer]. Pending an accepting vendor. Bennie Hind, Case Manager Assistant  07/13/20 13:49           Task Action Result Signed   DME: Wheelchair     CMA tasked for wheelchair CMA unable to send out referral at this time due to pending Medicare verbiage in MD progress note, and PT recommendation for wheelchair. CMA informed CM Angela. Bennie Hind, Case Manager Assistant  07/13/20 12:01

## 2020-07-13 NOTE — Allied Health Progress (Signed)
PM&R--ACUTE CARE SERVICE  OCCUPATIONAL THERAPY TREATMENT NOTE [UNIT:D14]    Treatment Day 3 of 10 in reporting period (medicare only)  Personal Protective Equipment Utilized: Gloves and Surgical Mask    ----------------------------------------------------------------------------------------------------------------------------------------------------------------------------  CURRENT Discharge Recommendations:         Current Functional Level: Minimal/Limited assistance        Equipment Needs Upon Discharge: Hip Kit and Engineer, maintenance        Anticipated Discharge Disposition: Home with Home Health, Home with Home Health OT, or Home with Home Health PT   ----------------------------------------------------------------------------------------------------------------------------------------------------------------------------       07/13/20 1128   Time and Intention   Document Type therapy progress note (daily note)   Mode of Treatment individual therapy;occupational therapy   Total Minutes, Occupational Therapy 34   Pain   Additional Documentation Pain Scale: Numbers Pre/Post-Treatment (Group)   Pain Assessment   Pretreatment Pain Rating 7/10   Posttreatment Pain Rating 8/10   Pre/Posttreatment Pain Comment RLE   Subjective   Subjective RN cleared for treatment, patient agreeable, interpretor present, warm hand off from PT for caregiver training with daughter.   ADL Re-training   ADL Intervention Lower Body Dressing   Independence Level (Lower Body Dressing) standby assist   Assistive Devices (Lower Body Dressing) reacher   Position (Lower Body Dressing) unsupported sitting   Lower Body Dressing Treatment/Intervention adaptive equipment training;compensatory training;energy conservation   Comment, (Lower Body Dressing) Patient used reacher with Min cues.   Bed Mobility   Bed Mobility supine-sit;sit-supine   Supine-Sit Independence (Bed Mobility) minimal assist (75-99% patient effort)   Sit-Supine Independence (Bed  Mobility) minimal assist (75-99% patient effort)   Assistive Device (Bed Mobility) No use of hospital bed features   Which direction did they exit bed today? right   Which direction does the pt normally exit bed? right   Comment, (Bed Mobility) Patient preferes not to roll due to pain and uses elbows for "crab crawl" to EOB.   Functional Mobility   Transfers sit-stand transfer;stand-sit transfer   Sit-Stand Independence (Transfers) standby assist   Assistive Device (Sit-Stand Transfers) walker, front-wheeled   Stand-Sit Independence (Transfers) standby assist   Assistive Device (Stand-Sit Transfers) walker, front-wheeled   Comment, (Transfers) Patient brought up [phone] and therapist suggest patient discuss with [facility] PT/OT after patient has made some more progress. Recommended FWW at this time.   Therapeutic Activity   Therapeutic Activity Sitting;Standing;Bending;Reaching   Comment, Therapeutic Activity Standing balance activity, weight shifting   Progress Summary (OT)   Progress Toward Functional Goals (OT) progress toward functional goals is good   Daily Progress Summary (OT) Patient reported 8/10 pain but was not limited during session.   Barriers to Overall Progress (OT) Pain and activity tolerance   Impairments Still Limiting Function (OT) Pain and activity toelrance   Current Functional Level Minimal/limited assistance   Therapy Plan Review/Discharge Plan (OT)   Planned Therapy Interventions (OT) activity tolerance training;adaptive equipment training;BADL retraining;patient/caregiver education/training  (Daughter present for caregiver training. Discussed BSC use at EOB and AE for dressing.)   Comment, Plan of Care Patient plans to continue with [facility] PT/OT   Therapy Plan Review (OT) care plan/treatment goals reviewed;participants included;patient;daughter   Equipment Needs Upon Discharge (OT) walker, front-wheeled;hip kit   Discharge Summary (OT)   Discharge Summary Statement (OT) if patient discharged prior to next  treatment the following summary above reflects current progress and further recommendations   Discharge Milestones   No OT Barriers to Discharge True       *  Pt transitioned back to the care of the RN; Call Button at [facility]*    Was a new therapy order generated with a "Pending Discharge" priority designation?  No. Not applicable--A new PT/OT order was not issued      Electronically Signed by:  Janyth Pupa COTA/L I  Department of Physical Medicine and Rehabilitation  PI# [phone]  Vocera [phone]

## 2020-07-14 ENCOUNTER — Other Ambulatory Visit: Payer: Self-pay

## 2020-07-25 ENCOUNTER — Telehealth: Payer: Self-pay

## 2020-07-25 NOTE — Medicare Assistant Surgeon Cert (Addendum)
[facility] [place] Assistant Surgeon Certification Form    When services are provided in a teaching hospital, the Medicare program reimburses for the services of the faculty assistant surgeons only if a qualified resident in a training program is not available. Acceptable reasons for use of a Animator instead of a qualified resident may be any of the following: 1) exceptional medical circumstances; 2) standing policy of no resident involvement; 3)complex medical procedures. (See CMS Policy 123XX123.1.7 Assistants at Surgery [facility])    A signed certification statement is required if the billing of faculty assistant surgeon services are to occur.        Patient:     Cristina Carter Date Of Service: 07/08/2020   Med Rec#: [id number] HAR#: [id number]     DOB: Jun 07, 1940 Admission Date: 07/06/2020   Surgical Procedure: ORIF Right femur Discharge Date: 07/13/2020   Surgeon: Dr. Casey Burkitt Assistant Surgeon: Petra Kuba, PA     Provider  Responding Instructions: Addend note & use dot phase ( .AsstSurgeonMedicare) to select appropriate response.

## 2020-07-25 NOTE — Telephone Encounter (Signed)
Patients home health nurse calling in stating patient still needs staples removed.  Return call made with spanish interpretor to let her know that a appointment has been made on 08/02/20 at 1245.  Nothing further needed.    [facility] RN      General line [phone]  Pediatrics [phone]  Hands [phone]  Ortho Oncology [phone]  Total Joints [phone]  Ortho Trauma [phone]

## 2020-08-01 ENCOUNTER — Other Ambulatory Visit: Payer: Self-pay | Admitting: ORTHOPAEDIC SURGERY

## 2020-08-01 DIAGNOSIS — M9701XA Periprosthetic fracture around internal prosthetic right hip joint, initial encounter: Secondary | ICD-10-CM

## 2020-08-01 NOTE — Patient Instructions (Signed)
Thank you for choosing [facility]     Thank you for choosing [facility]     For advice calls : [phone]     If it is an urgent request you can text page [phone]    For general appointments call [phone] option 1    To check the status of any diagnostics such as Physical therapy, MRI, CT call  Adrian at [phone]     FAX [phone]    If you have your images done at an outside facility, please request a CD to hand carry to your appointment.    Radiology [phone]

## 2020-08-02 ENCOUNTER — Ambulatory Visit
Admission: RE | Admit: 2020-08-02 | Discharge: 2020-08-02 | Disposition: A | Discharge status: Home / Self Care | Payer: Medicare Other | Source: Ambulatory Visit | Attending: ORTHOPAEDIC SURGERY | Admitting: ORTHOPAEDIC SURGERY

## 2020-08-02 ENCOUNTER — Ambulatory Visit (HOSPITAL_BASED_OUTPATIENT_CLINIC_OR_DEPARTMENT_OTHER): Payer: Medicare Other | Admitting: ORTHOPAEDIC SURGERY

## 2020-08-02 DIAGNOSIS — M9701XA Periprosthetic fracture around internal prosthetic right hip joint, initial encounter: Secondary | ICD-10-CM | POA: Insufficient documentation

## 2020-08-02 DIAGNOSIS — M9701XD Periprosthetic fracture around internal prosthetic right hip joint, subsequent encounter: Secondary | ICD-10-CM

## 2020-08-02 DIAGNOSIS — Z4889 Encounter for other specified surgical aftercare: Secondary | ICD-10-CM

## 2020-08-02 DIAGNOSIS — Z96641 Presence of right artificial hip joint: Secondary | ICD-10-CM

## 2020-08-02 NOTE — Progress Notes (Signed)
Orthopaedic Trauma Surgery Clinic Note    Reason for visit: 3 week follow-up    Date of Surgery:  07/08/20  Surgery:  ORIF R femur s/p R periprosthetic hip fx    Subjective:  Jaileen Malkiewicz is a 3yrfemale presents today for routine follow up after above procedure. Overall she is recovering well from her injury. She has been ambulating with a walker or wheelchair and has been participating in home PT. Her pain is controlled on OTC pain medication. She denies numbness and tingling.     Objective:  There were no vitals taken for this visit.    PE:  Gen: alert, oriented, comfortable  Heart: regular pulse rate  Lungs: no respiratory distress  Musculoskeletal:     Right lower extremity:   Incision is C/D/I, no erythema or drainage, staples intact  Vascular: palpable dorsalis pedis pulse, toes warm and well perfused, capillary refill < 2s  Motor function: intact DF/PF/EHL  Sensory: S/S/SP/DP/T intact to light touch    Imaging: I personally reviewed the imaging.  Radiographs taken today demonstrate appropriate interval healing of the right periprosthetic femur fracture with stable hardware.    Assesment and Plan:  MClarcie Lowisis a 89y[place]ale who is 3 weeks s/p ORIF R femur for a R [place] B1 periprosthetic hip fracture, doing well. Suture were removed in clinic. All questions were answered.    - Continue home PT  - Follow up: 6 weeks  - Imaging: AP/lateral Right femur    [facility]   08/02/2020  2:37 PM    I saw and evaluated the patient and agree with the student's note/plan as above.     Briefly, 6F s/p ORIF right periprosthetic femur fracture. Has been WBAT. Doing well. Accompanied by family.    Right femur incisions healed. Staples removed today. Sensation intact to light touch deep peroneal, superficial peroneal, saphenous, and sural nerves.  Able to flex and extend hip, knee, ankle, and toes.      Imaging demonstrates right femur ORIF with interval  healing and stable implants.     Plan for WBAT and PT for gait and strengthening. Follow up 6 weeks with new imaging of femur.    [name]

## 2020-08-16 ENCOUNTER — Telehealth: Payer: Self-pay

## 2020-08-16 NOTE — Telephone Encounter (Signed)
Deanna physical therapist from [facility] calling in to get verbal orders to extend PT for one time a week for three weeks.  Message left approving it.    [facility] RN      General line [phone]  Pediatrics [phone]  Hands [phone]  Ortho Oncology [phone]  Total Joints [phone]  Ortho Trauma [phone]

## 2020-08-30 ENCOUNTER — Telehealth: Payer: Self-pay

## 2020-08-30 NOTE — Telephone Encounter (Signed)
Patient's home health nurse called in reporting that the patient is having pain. He was with the patient and provided translation services. The patient reported that this pain is not new and that she has not experienced any trauma or fall recently. She was taking Tylenol, but not as frequently as prescribed. I asked that she start taking the Tylenol as prescribed at the frequency prescribed. I also asked that she start taking Ibuprofen if Tylenol is not managing her pain adequately. They understand that she should call back to clinic if this pain management regimen is not controlling her pain. They verbalized understanding of the new plan.     Ludwig Clarks, RN

## 2020-09-08 ENCOUNTER — Other Ambulatory Visit: Payer: Self-pay | Admitting: ORTHOPAEDIC SURGERY

## 2020-09-08 DIAGNOSIS — M9701XA Periprosthetic fracture around internal prosthetic right hip joint, initial encounter: Secondary | ICD-10-CM

## 2020-09-08 NOTE — Patient Instructions (Signed)
Thank you for choosing [facility]     Thank you for choosing [facility]     For advice calls : [phone]     If it is an urgent request you can text page [phone]    For general appointments call [phone] option 1    To check the status of any diagnostics such as Physical therapy, MRI, CT call  Adrian at [phone]     FAX [phone]    If you have your images done at an outside facility, please request a CD to hand carry to your appointment.    Radiology [phone]    If you require the assistance of a wheel chair. Prior to arrival please call [phone] and coordinate with Guest Relations. They will provide you with a wheelchair.

## 2020-09-13 ENCOUNTER — Ambulatory Visit
Admission: RE | Admit: 2020-09-13 | Discharge: 2020-09-13 | Disposition: A | Discharge status: Home / Self Care | Payer: Medicare Other | Source: Ambulatory Visit | Attending: ORTHOPAEDIC SURGERY | Admitting: ORTHOPAEDIC SURGERY

## 2020-09-13 ENCOUNTER — Ambulatory Visit (HOSPITAL_BASED_OUTPATIENT_CLINIC_OR_DEPARTMENT_OTHER): Payer: Medicare Other | Admitting: ORTHOPAEDIC SURGERY

## 2020-09-13 DIAGNOSIS — Z4889 Encounter for other specified surgical aftercare: Secondary | ICD-10-CM

## 2020-09-13 DIAGNOSIS — M9701XA Periprosthetic fracture around internal prosthetic right hip joint, initial encounter: Secondary | ICD-10-CM | POA: Insufficient documentation

## 2020-09-13 DIAGNOSIS — M9701XD Periprosthetic fracture around internal prosthetic right hip joint, subsequent encounter: Secondary | ICD-10-CM | POA: Insufficient documentation

## 2020-09-13 NOTE — Progress Notes (Signed)
Orthopaedic Trauma Surgery Clinic Note    Reason for visit: 10 week follow-up    Date of Surgery:  07/08/20  Surgery:  ORIF R femur s/p R periprosthetic hip fx    Subjective:  Cristina Carter is a 36yrfemale presents today for routine follow up after above procedure. She is here with her son. She has been ambulating with a walker and uses it to sit when she is in pain. Her pain is improving, and is mostly in her R hip. She has been participating in PT 1-2 times a week and is getting stronger.    Objective:  There were no vitals taken for this visit.    PE:  Gen: alert, oriented, comfortable    Right lower extremity:   Motor function: intact HF/HE/KF/KE, 5/5 strength    Imaging: Radiographs demonstrating R THA and TKA with lateral plate and no evidence of fracture.    Assesment and Plan:  Cristina Henrettyis a 89yremale who is 10 weeks s/p ORIF R femur for a R [place] B1 periprosthetic hip fracture. She is doing well. Improvement with ambulation and PT. She can follow up PRN.     Cristina [email or web address]  Ortho Trauma Sub-Intern    I saw and evaluated the patient and agree with the student's note/plan as above.     Briefly, 32F s/p ORIF right periprosthetic femur fracture. Has been doing well mostly walking with walker but able to take some steps independently. Accompanied by her son who is helping with translation.Imaging demonstrates stable alignment with healing and no complications.   -Continue WBAT without restriction  -Follow up PRN    Cristina AngstMD

## 2022-09-14 IMAGING — CR FOOT RT 3 VWS MIN
1 series · 3 of 3 positions shown · non-contrast
Comparison: 01/29/2011

Right foot radiographs, 3 views
INDICATION: Osteoarthritis of foot. Burning sensation to feet.

[Series 1: x foot ap right · 0.15mm/px · 3 of 3 slices shown]
[im 1/3]
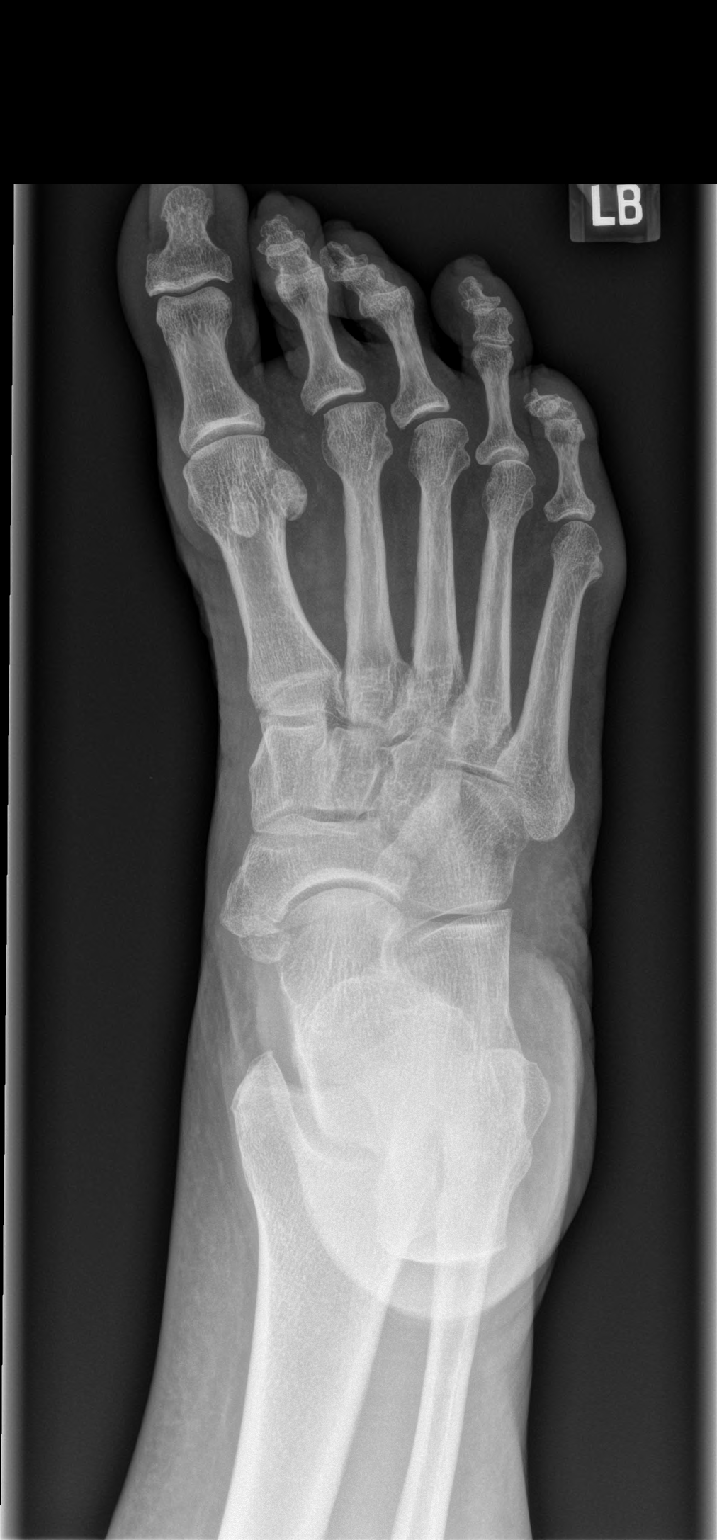
[im 2/3]
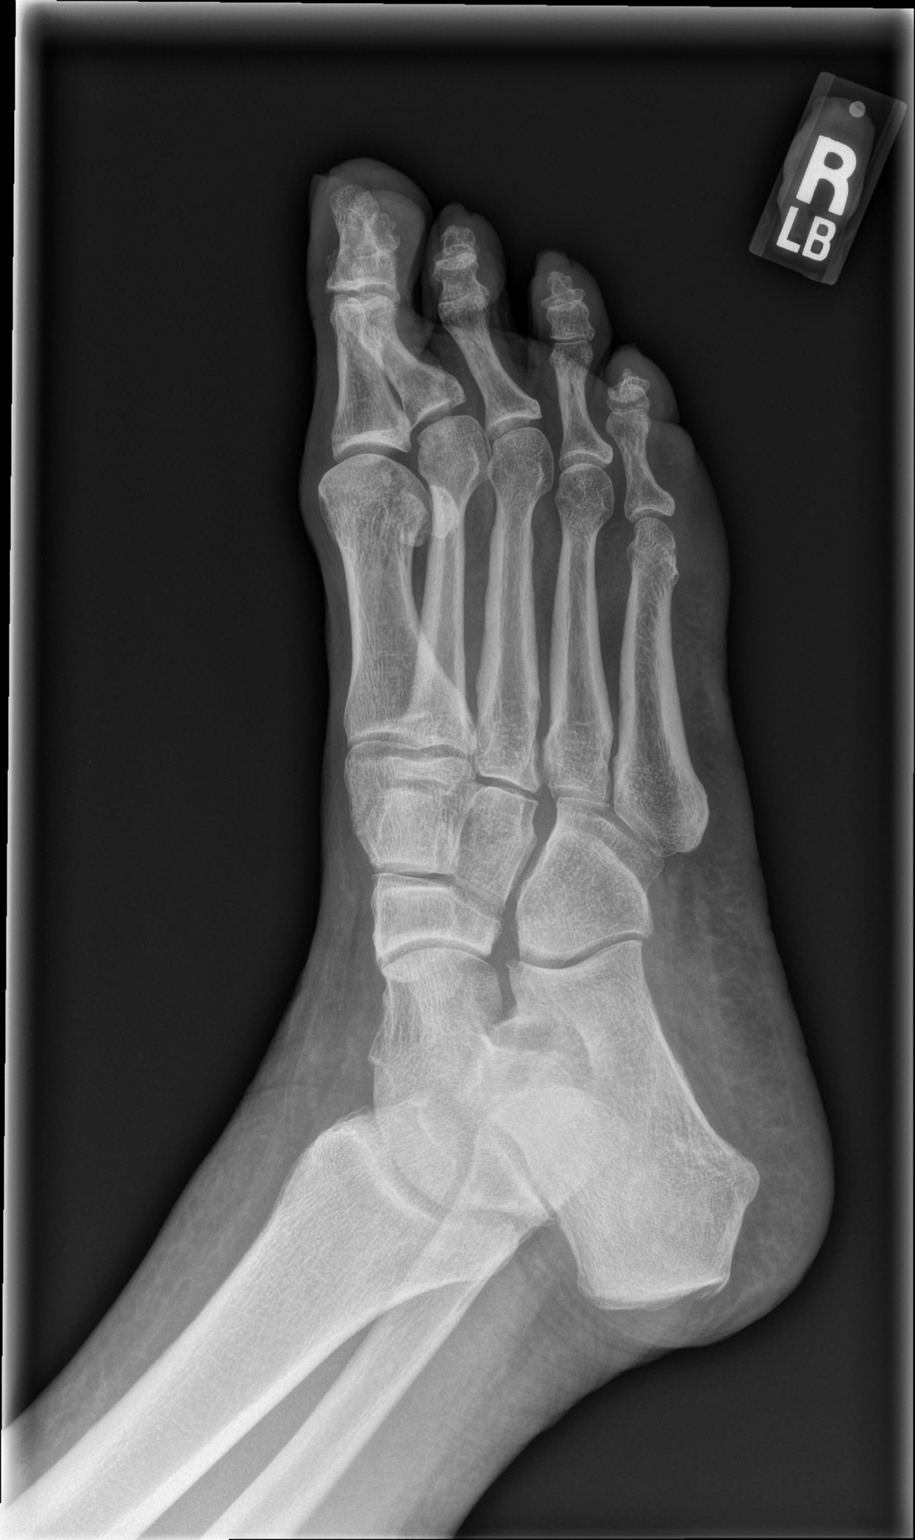
[im 3/3]
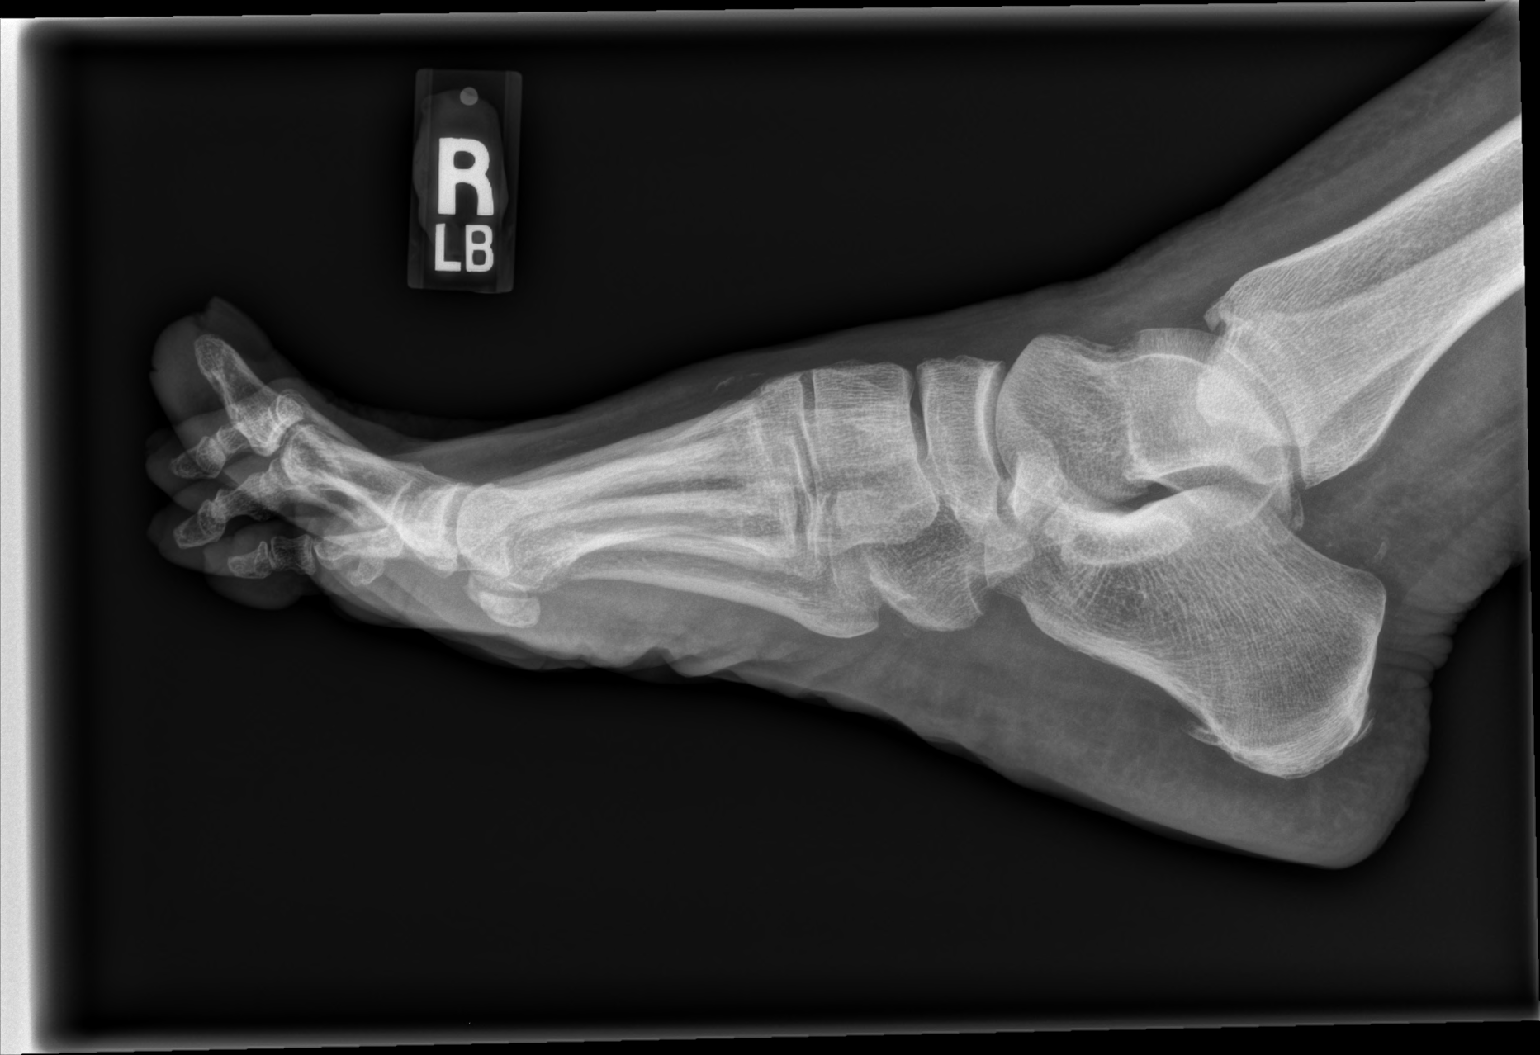

[3 of 3 positions shown; findings below may reference images not displayed]

FINDINGS: No acute fracture. No dislocation. Varus deformity of the second-fifth MTP joints. Metatarsus primus varus. No erosion. No acute fracture. No dislocation. No ankle joint effusion. No tarsal coalition. Os navicular with mild degenerative changes of the synchondrosis, unchanged. Small linear vascular calcifications along the dorsal aspect of the midfoot seen on lateral view, likely vascular. Small calcaneal spurs. Minimal scattered DJD of the midfoot and forefoot.
IMPRESSION: 1.
Metatarsus primus varus with minimal scattered DJD of the midfoot and forefoot.

2.
Os navicular with mild degenerative change of the synchondrosis, unchanged.

## 2022-09-14 IMAGING — CR KNEE LT 4 VWS MIN
1 series · 3 of 3 positions shown · non-contrast
Comparison: 05/09/2020, 02/13/2016

Left knee radiographs, 3 views
INDICATION: Left knee pain

[Series 1: t knee tunnel left · 0.15mm/px · 3 of 3 slices shown]
[im 1/3]
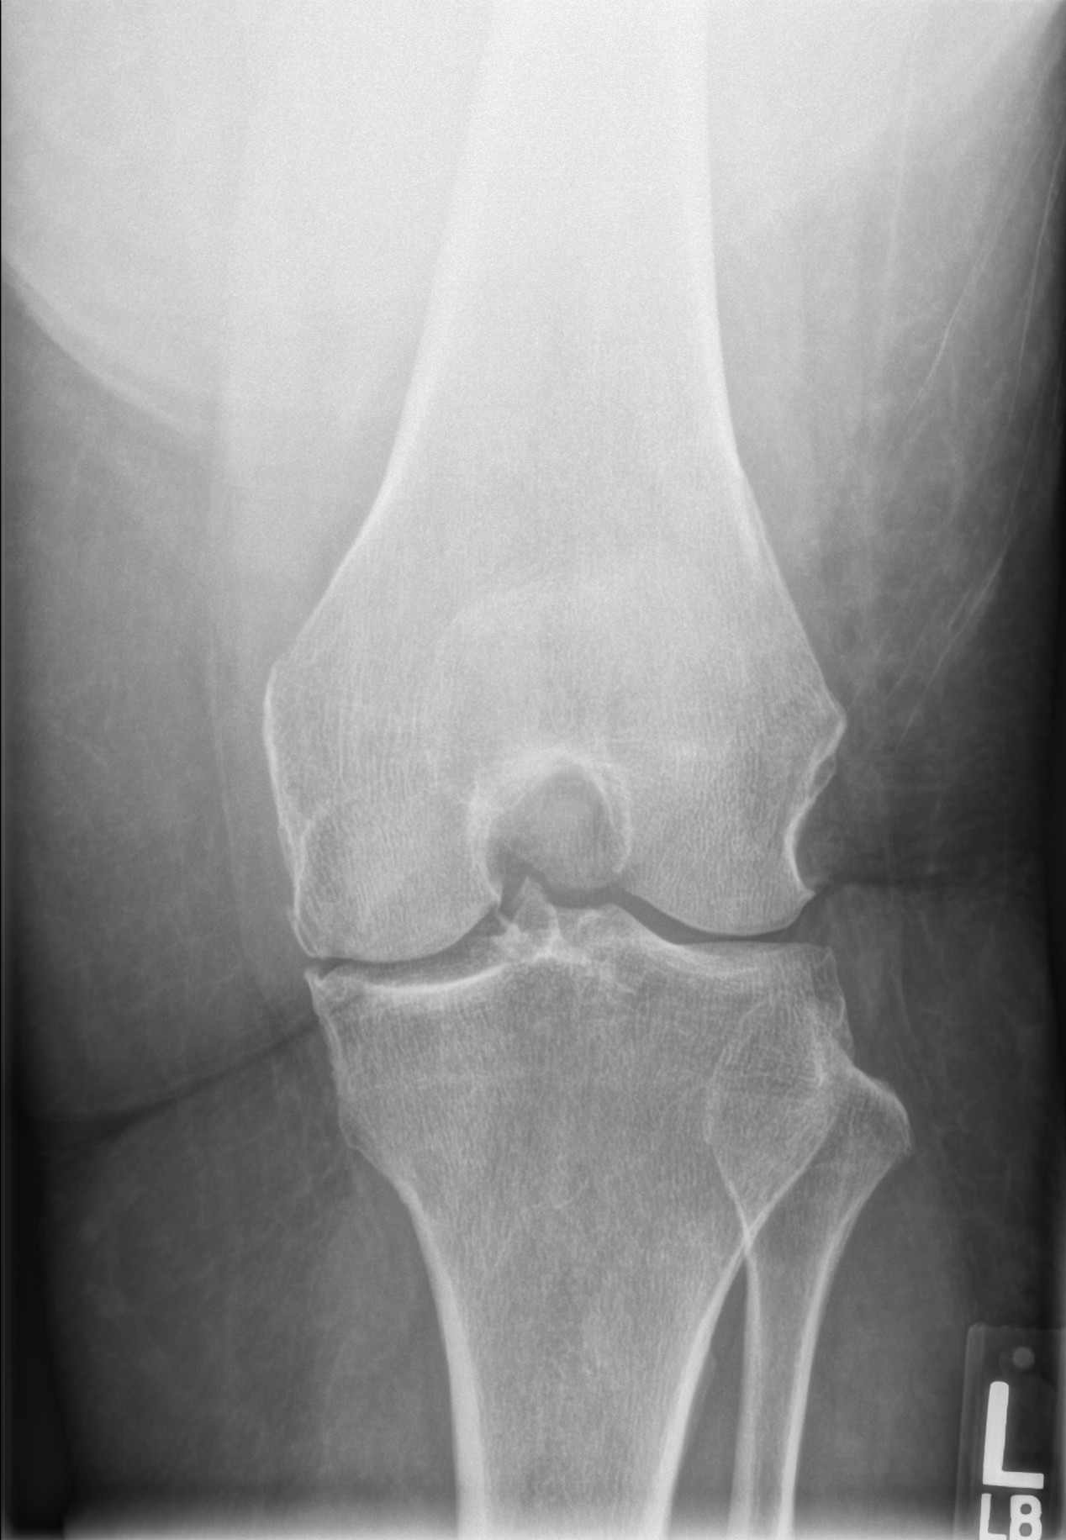
[im 2/3]
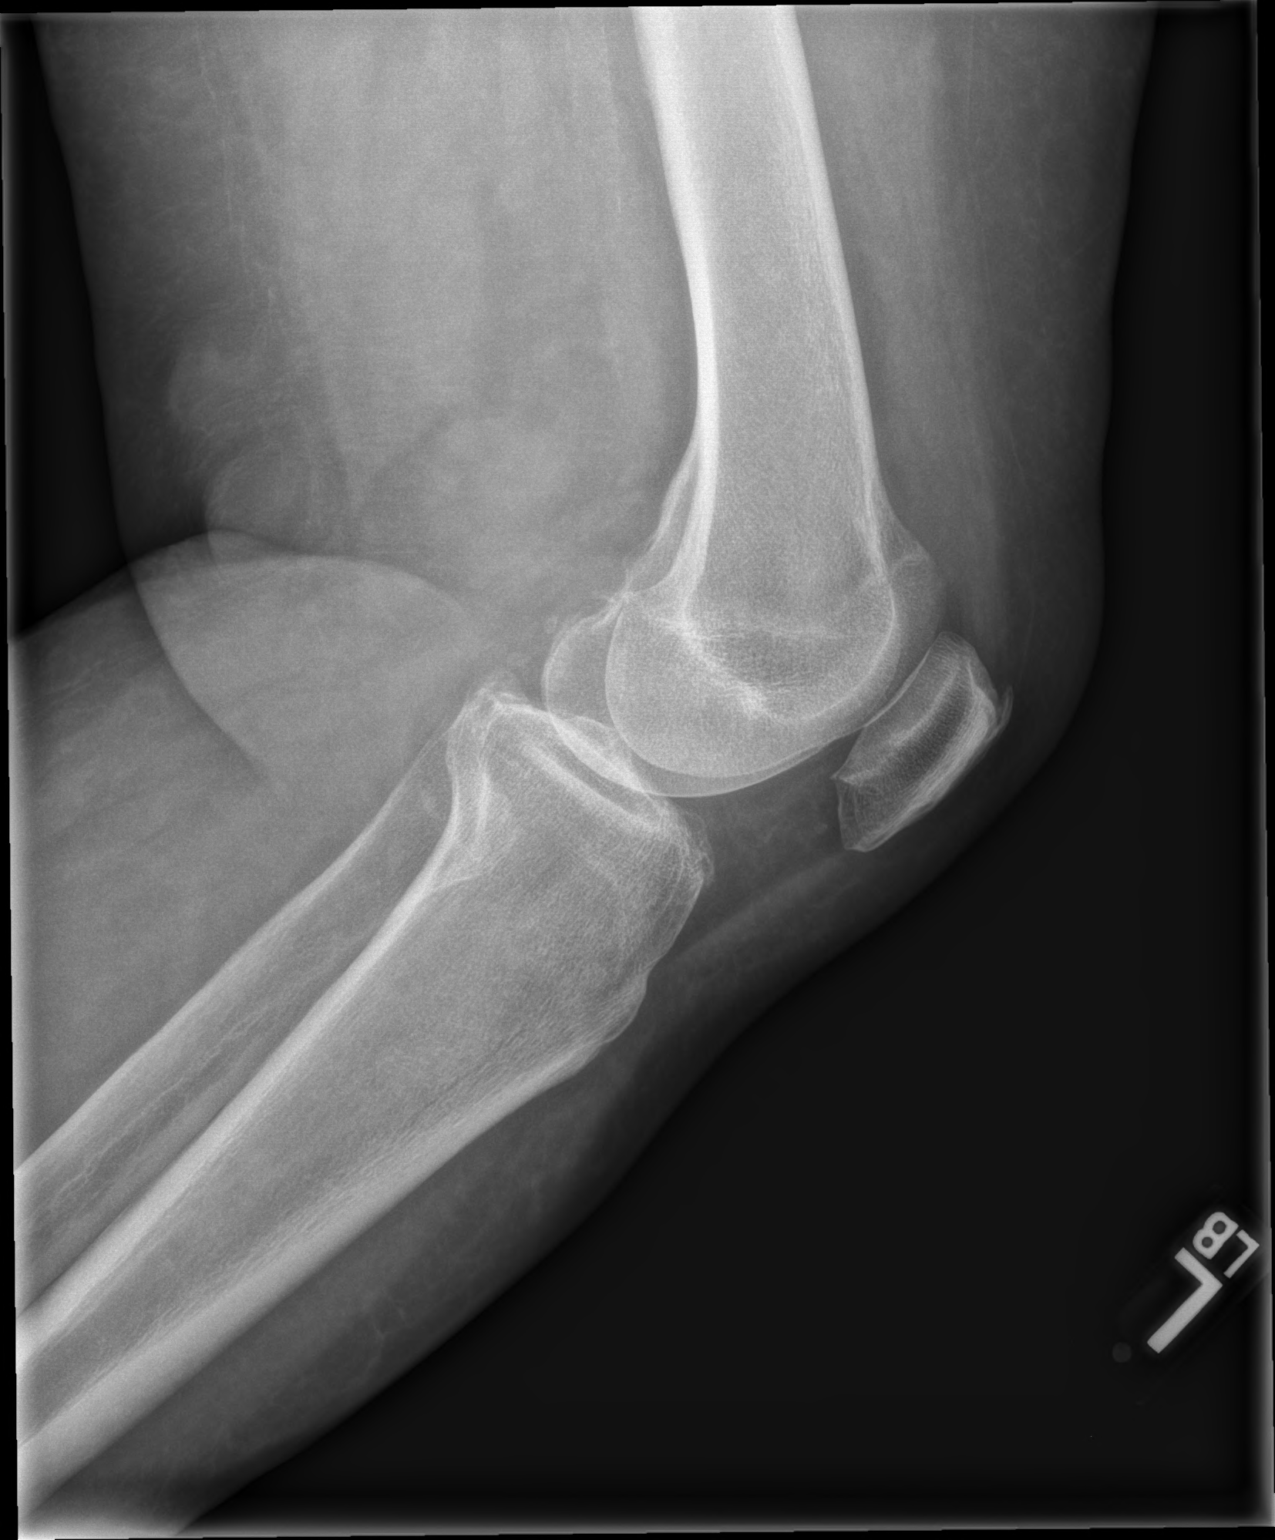
[im 3/3]
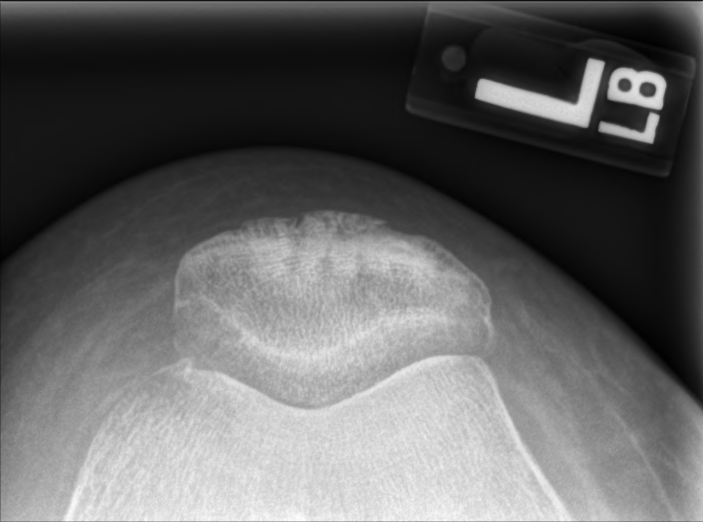

[3 of 3 positions shown; findings below may reference images not displayed]

FINDINGS: No acute fracture or dislocation. Moderate medial compartment DJD. Small marginal osteophytes. No joint effusion.
IMPRESSION: Moderate left knee medial compartment DJD, progressed from prior radiographs 02/13/2016.

## 2022-09-14 IMAGING — CR FOOT LT 3 VWS MIN
1 series · 3 of 3 positions shown · non-contrast
Comparison: 10/11/2010

Left foot radiographs, 3 views
INDICATION: Osteoarthritis of the foot. Burning sensation to the feet.

[Series 1: x foot ap left · 0.15mm/px · 3 of 3 slices shown]
[im 1/3]
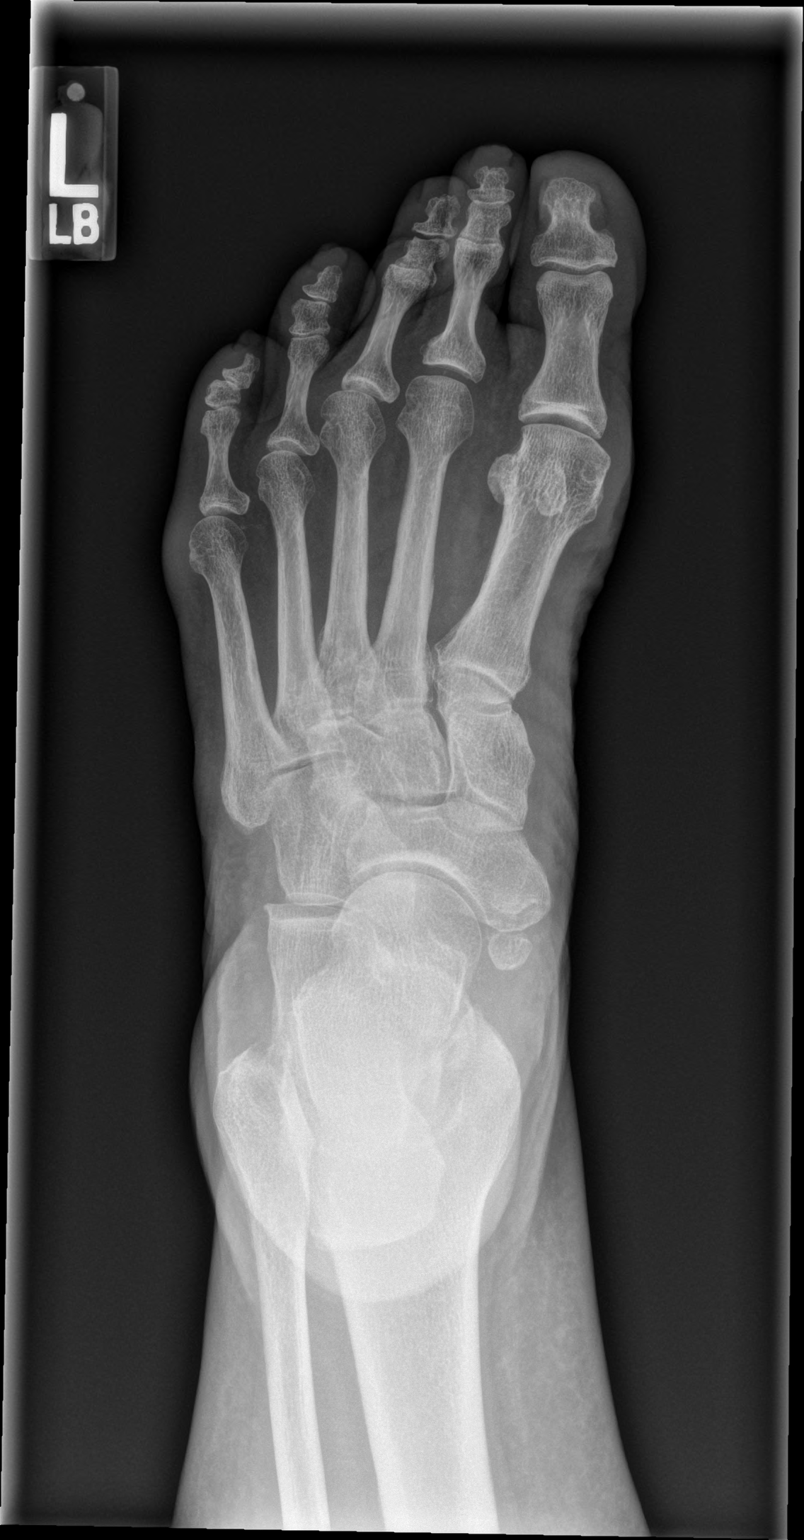
[im 2/3]
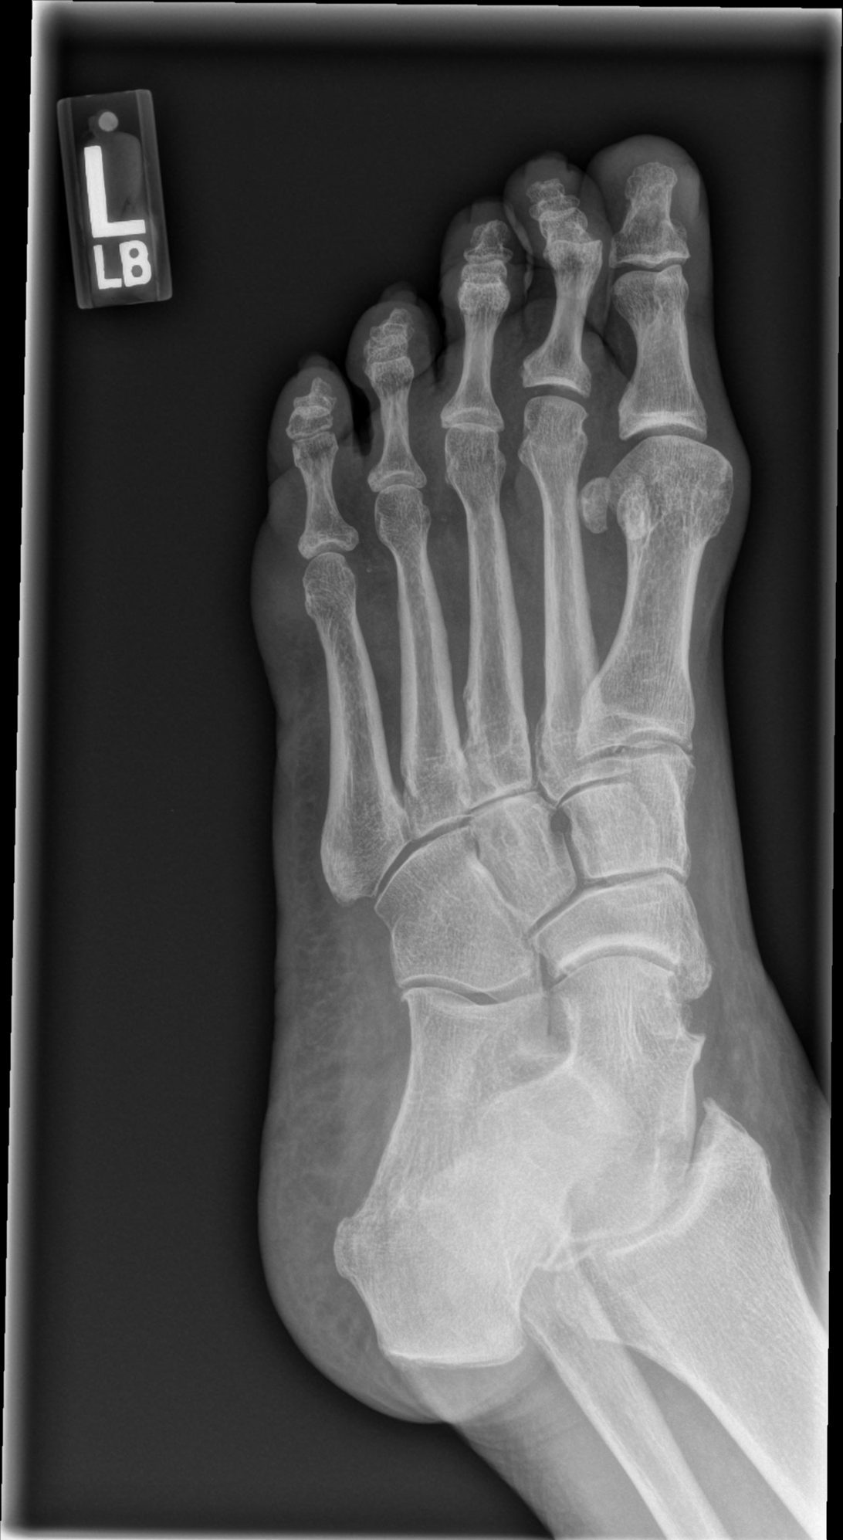
[im 3/3]
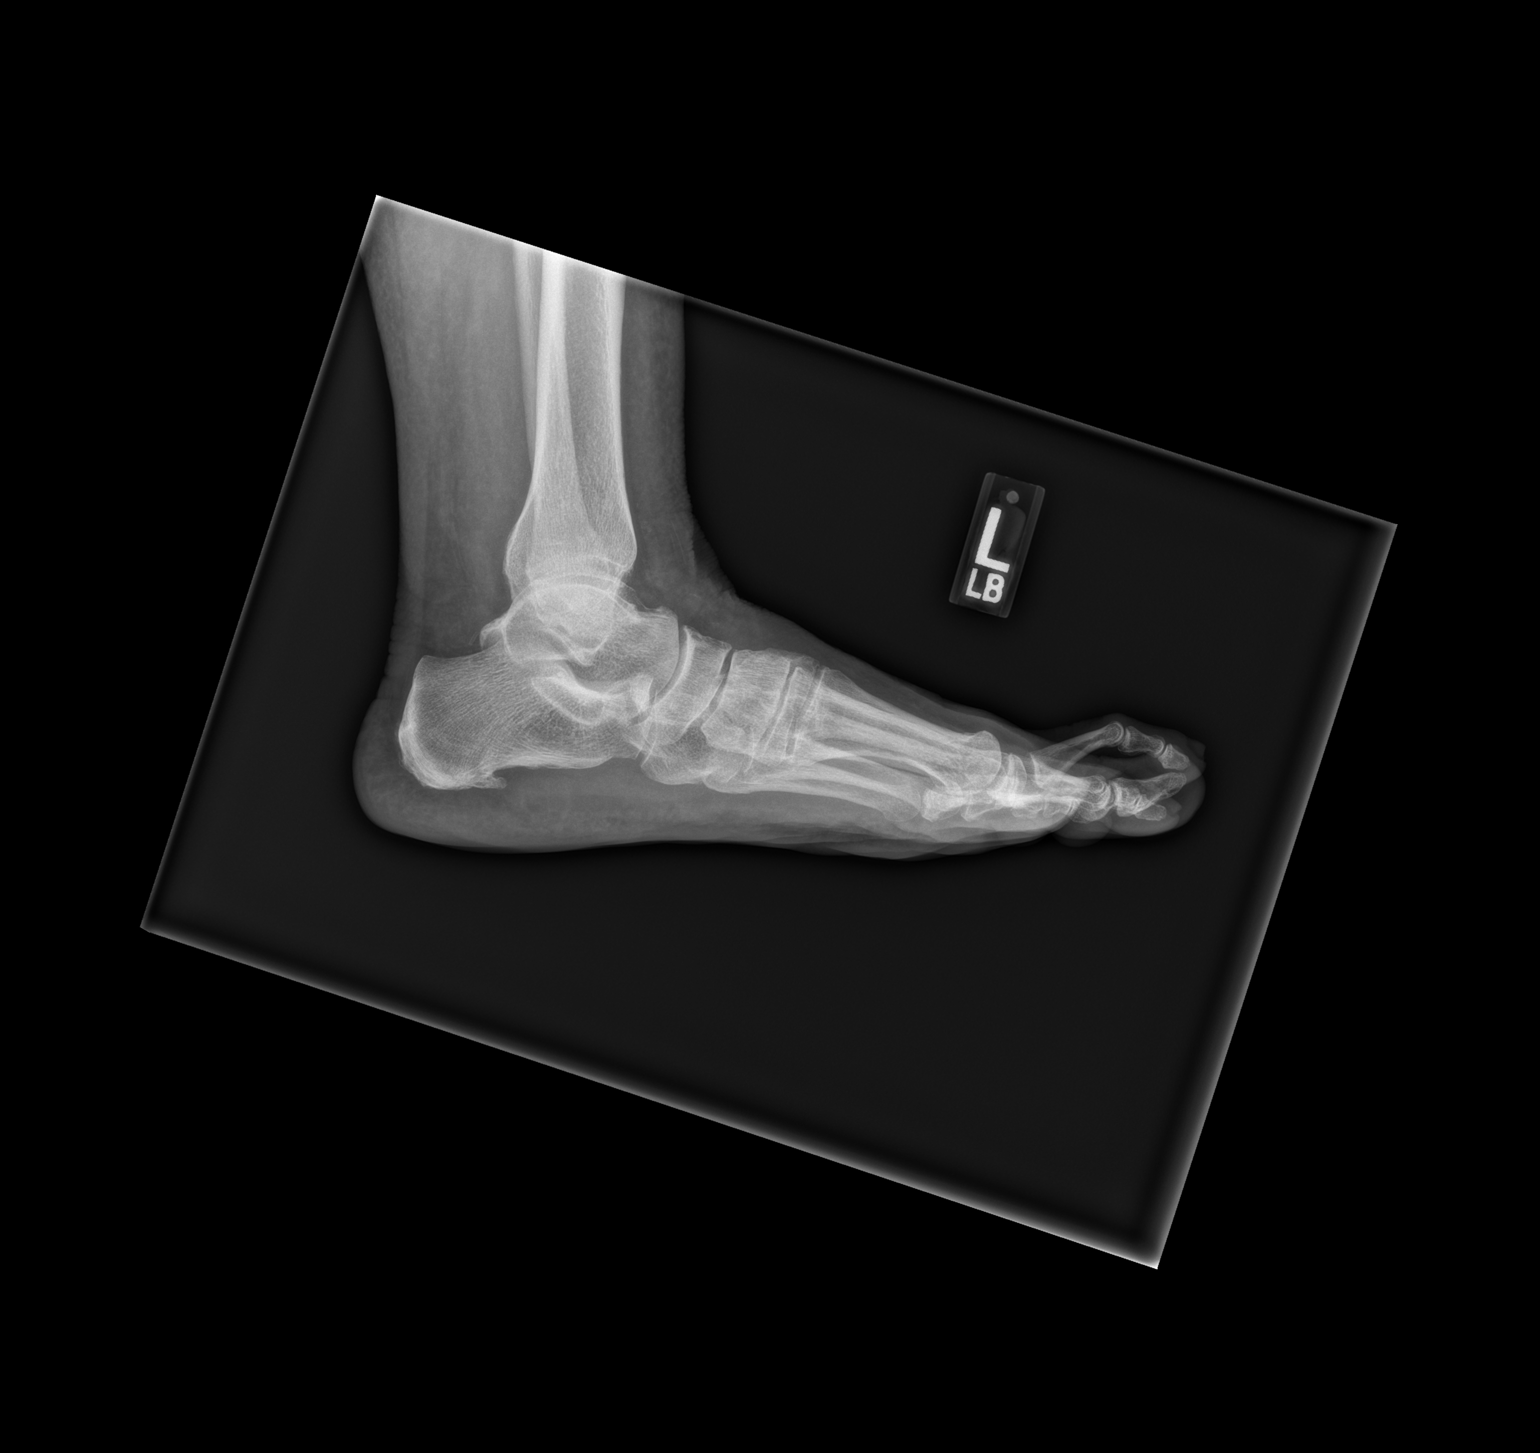

[3 of 3 positions shown; findings below may reference images not displayed]

FINDINGS: Os navicular present. No tarsal coalition. Pes planus. No acute fracture or dislocation. No ankle joint effusion. Small plantar calcaneal spur. Mild first MTP joint DJD.

Deformity of the second-fifth MTP joints. Moderate third TMT joint DJD. Mild second TMT joint DJD. Mild naviculocuneiform joint DJD. No erosions. Soft tissue prominence near the fifth MTP joint. Soft tissue prominence about the mid and forefoot.
IMPRESSION: 1.
Osteopenia with no acute osseous finding.

2.
Pes planus with moderate midfoot degenerative changes. Valgus deformities of the second-fifth MTP joints.

3.
No evidence of inflammatory arthropathy.

## 2022-09-14 IMAGING — CR KNEES STANDING AP BIL
1 series · 1 of 1 positions shown · non-contrast
Comparison: 05/09/2020, 03/17/2014

Bilateral standing knee radiograph, 1 view
INDICATION: Bilateral knee pain, osteoarthritis

[w knee ap bilat]
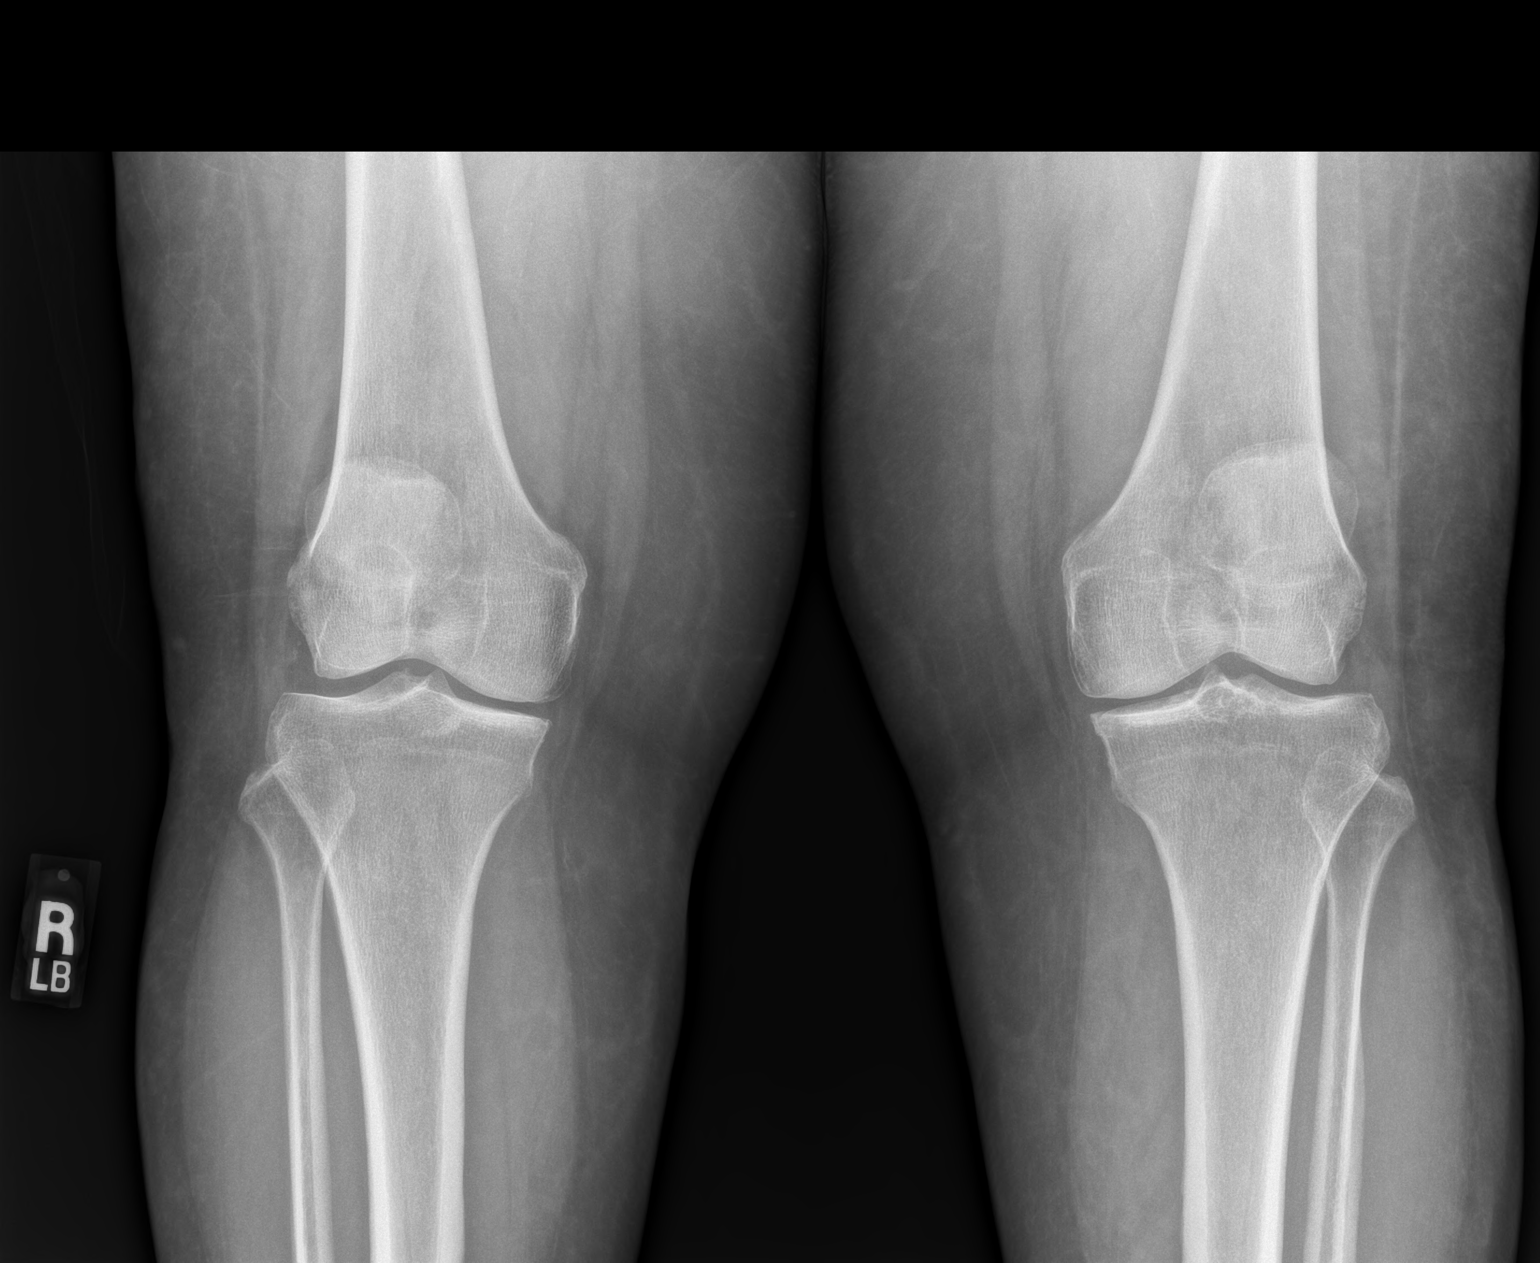

[1 of 1 positions shown; findings below may reference images not displayed]

FINDINGS: No acute fracture. No dislocation. Joint spaces are intact. Normal alignment. Soft tissues are unremarkable.
IMPRESSION: Unremarkable single bilateral standing knee radiograph.
